# Patient Record
Sex: Male | Born: 1985 | Race: White | Hispanic: No | Marital: Married | Smoking: Current every day smoker
Health system: Southern US, Community
[De-identification: ages and names within clinical notes are randomized; demographics above are authoritative.]

## PROBLEM LIST (undated history)

## (undated) DIAGNOSIS — R748 Abnormal levels of other serum enzymes: Secondary | ICD-10-CM

## (undated) DIAGNOSIS — F419 Anxiety disorder, unspecified: Secondary | ICD-10-CM

## (undated) DIAGNOSIS — E669 Obesity, unspecified: Secondary | ICD-10-CM

## (undated) DIAGNOSIS — F32A Depression, unspecified: Secondary | ICD-10-CM

## (undated) DIAGNOSIS — E785 Hyperlipidemia, unspecified: Secondary | ICD-10-CM

## (undated) DIAGNOSIS — I10 Essential (primary) hypertension: Secondary | ICD-10-CM

## (undated) DIAGNOSIS — Z8719 Personal history of other diseases of the digestive system: Secondary | ICD-10-CM

## (undated) DIAGNOSIS — R7309 Other abnormal glucose: Secondary | ICD-10-CM

## (undated) DIAGNOSIS — F329 Major depressive disorder, single episode, unspecified: Secondary | ICD-10-CM

## (undated) DIAGNOSIS — E119 Type 2 diabetes mellitus without complications: Secondary | ICD-10-CM

## (undated) DIAGNOSIS — J45909 Unspecified asthma, uncomplicated: Secondary | ICD-10-CM

## (undated) DIAGNOSIS — M549 Dorsalgia, unspecified: Secondary | ICD-10-CM

## (undated) DIAGNOSIS — K602 Anal fissure, unspecified: Secondary | ICD-10-CM

## (undated) DIAGNOSIS — Z8619 Personal history of other infectious and parasitic diseases: Secondary | ICD-10-CM

## (undated) DIAGNOSIS — G4730 Sleep apnea, unspecified: Secondary | ICD-10-CM

## (undated) HISTORY — DX: Essential (primary) hypertension: I10

## (undated) HISTORY — DX: Sleep apnea, unspecified: G47.30

## (undated) HISTORY — DX: Obesity, unspecified: E66.9

## (undated) HISTORY — DX: Depression, unspecified: F32.A

## (undated) HISTORY — DX: Anal fissure, unspecified: K60.2

## (undated) HISTORY — DX: Unspecified asthma, uncomplicated: J45.909

## (undated) HISTORY — DX: Type 2 diabetes mellitus without complications: E11.9

## (undated) HISTORY — DX: Personal history of other diseases of the digestive system: Z87.19

## (undated) HISTORY — DX: Personal history of other infectious and parasitic diseases: Z86.19

## (undated) HISTORY — DX: Hyperlipidemia, unspecified: E78.5

## (undated) HISTORY — DX: Abnormal levels of other serum enzymes: R74.8

## (undated) HISTORY — DX: Other abnormal glucose: R73.09

## (undated) HISTORY — DX: Major depressive disorder, single episode, unspecified: F32.9

## (undated) HISTORY — DX: Dorsalgia, unspecified: M54.9

## (undated) HISTORY — DX: Anxiety disorder, unspecified: F41.9

---
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
## 2009-04-09 ENCOUNTER — Ambulatory Visit: Payer: Self-pay | Admitting: Licensed Clinical Social Worker

## 2009-04-14 ENCOUNTER — Ambulatory Visit: Payer: Self-pay | Admitting: Licensed Clinical Social Worker

## 2009-04-29 ENCOUNTER — Ambulatory Visit: Payer: Self-pay | Admitting: Licensed Clinical Social Worker

## 2009-05-26 ENCOUNTER — Ambulatory Visit: Payer: Self-pay | Admitting: Licensed Clinical Social Worker

## 2011-02-16 ENCOUNTER — Emergency Department (HOSPITAL_COMMUNITY)
Admission: EM | Admit: 2011-02-16 | Discharge: 2011-02-16 | Disposition: A | Discharge status: Home / Self Care | Payer: Self-pay | Attending: Emergency Medicine | Admitting: Emergency Medicine

## 2011-02-16 DIAGNOSIS — M545 Low back pain, unspecified: Secondary | ICD-10-CM | POA: Insufficient documentation

## 2011-02-16 DIAGNOSIS — M79609 Pain in unspecified limb: Secondary | ICD-10-CM | POA: Insufficient documentation

## 2011-02-16 DIAGNOSIS — E669 Obesity, unspecified: Secondary | ICD-10-CM | POA: Insufficient documentation

## 2011-02-16 DIAGNOSIS — F411 Generalized anxiety disorder: Secondary | ICD-10-CM | POA: Insufficient documentation

## 2011-02-16 DIAGNOSIS — M5430 Sciatica, unspecified side: Secondary | ICD-10-CM | POA: Insufficient documentation

## 2012-04-23 ENCOUNTER — Ambulatory Visit (INDEPENDENT_AMBULATORY_CARE_PROVIDER_SITE_OTHER): Payer: 59 | Admitting: Internal Medicine

## 2012-04-23 ENCOUNTER — Other Ambulatory Visit (INDEPENDENT_AMBULATORY_CARE_PROVIDER_SITE_OTHER): Payer: 59

## 2012-04-23 ENCOUNTER — Encounter: Payer: Self-pay | Admitting: Internal Medicine

## 2012-04-23 ENCOUNTER — Other Ambulatory Visit: Payer: Self-pay | Admitting: *Deleted

## 2012-04-23 VITALS — BP 118/72 | HR 99 | Temp 97.5°F | Ht 77.0 in | Wt >= 6400 oz

## 2012-04-23 DIAGNOSIS — Z131 Encounter for screening for diabetes mellitus: Secondary | ICD-10-CM

## 2012-04-23 DIAGNOSIS — Z130 Encounter for screening for diseases of the blood and blood-forming organs and certain disorders involving the immune mechanism: Secondary | ICD-10-CM

## 2012-04-23 DIAGNOSIS — Z13220 Encounter for screening for lipoid disorders: Secondary | ICD-10-CM

## 2012-04-23 DIAGNOSIS — Z113 Encounter for screening for infections with a predominantly sexual mode of transmission: Secondary | ICD-10-CM

## 2012-04-23 DIAGNOSIS — Z0000 Encounter for general adult medical examination without abnormal findings: Secondary | ICD-10-CM

## 2012-04-23 LAB — CBC
HCT: 44 % (ref 39.0–52.0)
MCV: 90.5 fl (ref 78.0–100.0)
RBC: 4.87 Mil/uL (ref 4.22–5.81)
RDW: 13.2 % (ref 11.5–14.6)
WBC: 8.4 10*3/uL (ref 4.5–10.5)

## 2012-04-23 LAB — BASIC METABOLIC PANEL
Calcium: 9.6 mg/dL (ref 8.4–10.5)
GFR: 96.47 mL/min (ref >60.00)
Glucose, Bld: 110 mg/dL — ABNORMAL HIGH (ref 70–99)
Potassium: 4.8 mEq/L (ref 3.5–5.1)
Sodium: 138 mEq/L (ref 135–145)

## 2012-04-23 LAB — LIPID PANEL: Total CHOL/HDL Ratio: 7

## 2012-04-23 LAB — RPR

## 2012-04-23 LAB — HEMOGLOBIN A1C: Hgb A1c MFr Bld: 5.8 % (ref 4.6–6.5)

## 2012-04-23 NOTE — Progress Notes (Signed)
HPI  Pt presents to the clinic today to establish care. He has not had insurance in a number of years and would like to get a physical with lab work today. He has no concerns.  Past Medical History  Diagnosis Date  . Asthma   . Depression   . History of chicken pox     Current Outpatient Prescriptions  Medication Sig Dispense Refill  . ibuprofen (ADVIL,MOTRIN) 800 MG tablet Take 800 mg by mouth as needed.         No Known Allergies  Family History  Problem Relation Age of Onset  . Diabetes Maternal Grandmother   . Cancer Maternal Grandfather     Lung Cancer  . Hypertension Maternal Grandfather   . Hypertension Paternal Grandfather   . Heart disease Paternal Grandfather   . Heart attack Paternal Grandfather     History   Social History  . Marital Status: Single    Spouse Name: N/A    Number of Children: N/A  . Years of Education: 16   Occupational History  . [employer]    Social History Main Topics  . Smoking status: Current Every Day Smoker  . Smokeless tobacco: Not on file  . Alcohol Use: Yes  . Drug Use: No  . Sexually Active: Not on file   Other Topics Concern  . Not on file   Social History Narrative   Regular exercise-noCaffeine Use-yes    ROS:  Constitutional: Denies fever, malaise, fatigue, headache or abrupt weight changes.  HEENT: Denies eye pain, eye redness, ear pain, ringing in the ears, wax buildup, runny nose, nasal congestion, bloody nose, or sore throat. Respiratory: Denies difficulty breathing, shortness of breath, cough or sputum production.   Cardiovascular: Denies chest pain, chest tightness, palpitations or swelling in the hands or feet.  Gastrointestinal: Denies abdominal pain, bloating, constipation, diarrhea or blood in the stool.  GU: Denies frequency, urgency, pain with urination, blood in urine, odor or discharge. Musculoskeletal: Denies decrease in range of motion, difficulty with gait, muscle pain or joint pain and swelling.   Skin: Denies redness, rashes, lesions or ulcercations.  Neurological: Denies dizziness, difficulty with memory, difficulty with speech or problems with balance and coordination.   No other specific complaints in a complete review of systems (except as listed in HPI above).  PE:  BP 118/72  Pulse 99  Temp 97.5 F (36.4 C) (Oral)  Ht 6\' 5"  (1.956 m)  Wt 419 lb 8 oz (190.284 kg)  BMI 49.75 kg/m2  SpO2 96% Wt Readings from Last 3 Encounters:  04/23/12 419 lb 8 oz (190.284 kg)    General: Appears his stated age, obese but well developed, well nourished in NAD. HEENT: Head: normal shape and size; Eyes: sclera white, no icterus, conjunctiva pink, PERRLA and EOMs intact; Ears: Tm's gray and intact, normal light reflex; Nose: mucosa pink and moist, septum midline; Throat/Mouth: Teeth present, mucosa pink and moist, no lesions or ulcerations noted.  Neck: Normal range of motion. Neck supple, trachea midline. No massses, lumps or thyromegaly present.  Cardiovascular: Normal rate and rhythm. S1,S2 noted.  No murmur, rubs or gallops noted. No JVD or BLE edema. No carotid bruits noted. Pulmonary/Chest: Normal effort and positive vesicular breath sounds. No respiratory distress. No wheezes, rales or ronchi noted.  Abdomen: Soft and nontender. Normal bowel sounds, no bruits noted. No distention or masses noted. Liver, spleen and kidneys non palpable. Musculoskeletal: Normal range of motion. No signs of joint swelling. No difficulty with  gait.  Neurological: Alert and oriented. Cranial nerves II-XII intact. Coordination normal. +DTRs bilaterally. Psychiatric: Mood and affect normal. Behavior is normal. Judgment and thought content normal.    Assessment and Plan:  Preventative Health Maintenance:  Start a diet and exercise program Will obtain basic screening labs today Flu declined Would like to get tdap at next visit  RTC in 1 year or sooner if needed

## 2012-04-23 NOTE — Patient Instructions (Signed)
Health Maintenance, Males A healthy lifestyle and preventative care can promote health and wellness.  Maintain regular health, dental, and eye exams.  Eat a healthy diet. Foods like vegetables, fruits, whole grains, low-fat dairy products, and lean protein foods contain the nutrients you need without too many calories. Decrease your intake of foods high in solid fats, added sugars, and salt. Get information about a proper diet from your caregiver, if necessary.  Regular physical exercise is one of the most important things you can do for your health. Most adults should get at least 150 minutes of moderate-intensity exercise (any activity that increases your heart rate and causes you to sweat) each week. In addition, most adults need muscle-strengthening exercises on 2 or more days a week.   Maintain a healthy weight. The body mass index (BMI) is a screening tool to identify possible weight problems. It provides an estimate of body fat based on height and weight. Your caregiver can help determine your BMI, and can help you achieve or maintain a healthy weight. For adults 20 years and older:  A BMI below 18.5 is considered underweight.  A BMI of 18.5 to 24.9 is normal.  A BMI of 25 to 29.9 is considered overweight.  A BMI of 30 and above is considered obese.  Maintain normal blood lipids and cholesterol by exercising and minimizing your intake of saturated fat. Eat a balanced diet with plenty of fruits and vegetables. Blood tests for lipids and cholesterol should begin at age 20 and be repeated every 5 years. If your lipid or cholesterol levels are high, you are over 50, or you are a high risk for heart disease, you may need your cholesterol levels checked more frequently.Ongoing high lipid and cholesterol levels should be treated with medicines, if diet and exercise are not effective.  If you smoke, find out from your caregiver how to quit. If you do not use tobacco, do not start.  If you  choose to drink alcohol, do not exceed 2 drinks per day. One drink is considered to be 12 ounces (355 mL) of beer, 5 ounces (148 mL) of wine, or 1.5 ounces (44 mL) of liquor.  Avoid use of street drugs. Do not share needles with anyone. Ask for help if you need support or instructions about stopping the use of drugs.  High blood pressure causes heart disease and increases the risk of stroke. Blood pressure should be checked at least every 1 to 2 years. Ongoing high blood pressure should be treated with medicines if weight loss and exercise are not effective.  If you are 45 to 28 years old, ask your caregiver if you should take aspirin to prevent heart disease.  Diabetes screening involves taking a blood sample to check your fasting blood sugar level. This should be done once every 3 years, after age 45, if you are within normal weight and without risk factors for diabetes. Testing should be considered at a younger age or be carried out more frequently if you are overweight and have at least 1 risk factor for diabetes.  Colorectal cancer can be detected and often prevented. Most routine colorectal cancer screening begins at the age of 50 and continues through age 75. However, your caregiver may recommend screening at an earlier age if you have risk factors for colon cancer. On a yearly basis, your caregiver may provide home test kits to check for hidden blood in the stool. Use of a small camera at the end of a tube,   to directly examine the colon (sigmoidoscopy or colonoscopy), can detect the earliest forms of colorectal cancer. Talk to your caregiver about this at age 50, when routine screening begins. Direct examination of the colon should be repeated every 5 to 10 years through age 75, unless early forms of pre-cancerous polyps or small growths are found.  Hepatitis C blood testing is recommended for all people born from 1945 through 1965 and any individual with known risks for hepatitis C.  Healthy  men should no longer receive prostate-specific antigen (PSA) blood tests as part of routine cancer screening. Consult with your caregiver about prostate cancer screening.  Testicular cancer screening is not recommended for adolescents or adult males who have no symptoms. Screening includes self-exam, caregiver exam, and other screening tests. Consult with your caregiver about any symptoms you have or any concerns you have about testicular cancer.  Practice safe sex. Use condoms and avoid high-risk sexual practices to reduce the spread of sexually transmitted infections (STIs).  Use sunscreen with a sun protection factor (SPF) of 30 or greater. Apply sunscreen liberally and repeatedly throughout the day. You should seek shade when your shadow is shorter than you. Protect yourself by wearing long sleeves, pants, a wide-brimmed hat, and sunglasses year round, whenever you are outdoors.  Notify your caregiver of new moles or changes in moles, especially if there is a change in shape or color. Also notify your caregiver if a mole is larger than the size of a pencil eraser.  A one-time screening for abdominal aortic aneurysm (AAA) and surgical repair of large AAAs by sound wave imaging (ultrasonography) is recommended for ages 65 to 75 years who are current or former smokers.  Stay current with your immunizations. Document Released: 10/14/2007 Document Revised: 07/10/2011 Document Reviewed: 09/12/2010 Exit[employer] Patient Information 2013 [employer]. Self-Exam Of The Testicles Young men ages 15-35 are the ones who most commonly get cancer of the testicles. About 300 young men die of this disease each year. Testicular cancer does occur in middle-aged or older men but to a lesser extent. This cancer can almost always be cured if it is found before it gets bad and if it is treated. WORDS TO KNOW:  Testicles are the 2 egg-shaped glands that make hormones and sperm in men.  The scrotum is the skin around  testicles.  Epididymis is the rope-like part that is behind and above each testis. It collects sperm made by the testis. WHICH MEN ARE AT HIGH RISK FOR CANCER OF THE TESTICLES?  Men between ages 15 to 31.  Men who are Caucasian.  Men who were born with a testicle that had not moved down into the scrotum.  Men whose testicles have gotten smaller because of an infection.  Men whose fathers or brothers have had cancer of the testicles. SYMPTOMS OF TESTICULAR CANCER  A painless swelling in one of your testicles.  A hard lump. Some lumps may be an infection.  A heavy feeling in your testicles.  An ache in your lower belly (abdomen) or groin. HOW OFTEN SHOULD I CHECK MY TESTICLES? You should check your testicles every month. HOW SHOULD I DO THIS CHECK? It is best to check your testicles right after a warm shower or bath.  Look at your testicles for any swelling. You may need to use a mirror.  Use both your hands to roll each testicle between your thumb and fingers. Feel for any lumps or changes in the size of the testicle. Press firmly. You   may find that one testicle is a little bigger than the other. This is normal.  Next, check the epididymis on each testicle. This is the part where most testicular cancers happen. It is normal for the epididymis to feel soft and uneven. When you get used to how your epididymis feels, you will be able to tell if there is any change.  WHAT IF I FIND ANY SWELLINGS OR LUMPS?  Call your doctor. Some lumps may be an infection. If the lump or swelling is a cancer, it can be treated before it gets worse.  Document Released: 07/14/2008 Document Revised: 07/10/2011 Document Reviewed: 07/14/2008 Exit[employer] Patient Information 2013 [employer].  

## 2012-04-24 LAB — HSV(HERPES SIMPLEX VRS) I + II AB-IGG: HSV 1 Glycoprotein G Ab, IgG: 0.22 IV

## 2012-04-25 ENCOUNTER — Encounter: Payer: Self-pay | Admitting: Internal Medicine

## 2012-04-29 ENCOUNTER — Telehealth: Payer: Self-pay | Admitting: *Deleted

## 2012-04-29 NOTE — Telephone Encounter (Signed)
Pt informed of results via VM and to callback office with any questions/concerns.      Maurice George,  Can you please send this and call Maurice George to let him know that his cholesterol is elevated. I suggest 3 months of lifestyle changes including avoiding fried foods and fast food in combination with at least 30 minutes of exercise 4 days out of the week. You should return for an OV in 3 months to reevaluate this. All of his other labs were normal. Thx!  Rene Kocher

## 2012-06-09 ENCOUNTER — Encounter (HOSPITAL_COMMUNITY): Payer: Self-pay

## 2012-06-09 ENCOUNTER — Emergency Department (HOSPITAL_COMMUNITY)
Admission: EM | Admit: 2012-06-09 | Discharge: 2012-06-09 | Disposition: A | Discharge status: Home / Self Care | Payer: 59 | Attending: Emergency Medicine | Admitting: Emergency Medicine

## 2012-06-09 DIAGNOSIS — K645 Perianal venous thrombosis: Secondary | ICD-10-CM

## 2012-06-09 DIAGNOSIS — F17200 Nicotine dependence, unspecified, uncomplicated: Secondary | ICD-10-CM | POA: Insufficient documentation

## 2012-06-09 DIAGNOSIS — Z79899 Other long term (current) drug therapy: Secondary | ICD-10-CM | POA: Insufficient documentation

## 2012-06-09 DIAGNOSIS — J45909 Unspecified asthma, uncomplicated: Secondary | ICD-10-CM | POA: Insufficient documentation

## 2012-06-09 DIAGNOSIS — Z8619 Personal history of other infectious and parasitic diseases: Secondary | ICD-10-CM | POA: Insufficient documentation

## 2012-06-09 DIAGNOSIS — Z8659 Personal history of other mental and behavioral disorders: Secondary | ICD-10-CM | POA: Insufficient documentation

## 2012-06-09 MED ORDER — LIDOCAINE 5 % EX OINT
TOPICAL_OINTMENT | Freq: Three times a day (TID) | CUTANEOUS | Status: DC | PRN
  Administered 2012-06-09: 09:00:00 via TOPICAL
  Filled 2012-06-09: qty 35.44

## 2012-06-09 MED ORDER — OXYCODONE-ACETAMINOPHEN 5-325 MG PO TABS: ORAL_TABLET | ORAL | Status: DC

## 2012-06-09 MED ORDER — HYDROCORTISONE 2.5 % RE CREA: TOPICAL_CREAM | RECTAL | Status: DC

## 2012-06-09 NOTE — ED Notes (Signed)
MD at bedside. 

## 2012-06-09 NOTE — ED Notes (Signed)
Rectal pain x4 days

## 2012-06-09 NOTE — ED Notes (Addendum)
Patient states that he has been having burning, sharp, throbbing pain in and around his rectum for 4 days.  States he does have a history of external hemorrhoids.  The patient states that he has had this type of pain before but has never been seen by a physician for it.  States he has had blood in his stool and on toilet paper after wiping the area.

## 2012-06-09 NOTE — ED Provider Notes (Signed)
History     CSN: [id number]  Arrival date & time 06/09/12  0715   First MD Initiated Contact with Patient 06/09/12 [phone]      Chief Complaint  Patient presents with  . Rectal Pain      HPI Pt was seen at 0745.   Per pt, c/o gradual onset and persistence of constant rectal "pain" for the past 4 days.  Pt states he has had intermittent rectal pain from external hemorrhoids "for years" but that it usually goes away with OTC products.  Pt states he has been using OTC hemorrhoidal products for the past 4 days without relief.  Denies constipation/diarrhea, no rectal bleeding or discharge, no fevers, no abd pain, no back pain, no perineal pain.    Past Medical History  Diagnosis Date  . Asthma   . Depression   . History of chicken pox     Past Surgical History  Procedure Laterality Date  . Tonsillectomy  1999    Family History  Problem Relation Age of Onset  . Diabetes Maternal Grandmother   . Cancer Maternal Grandfather     Lung Cancer  . Hypertension Maternal Grandfather   . Hypertension Paternal Grandfather   . Heart disease Paternal Grandfather   . Heart attack Paternal Grandfather     History  Substance Use Topics  . Smoking status: Current Every Day Smoker -- 0.50 packs/day    Types: Cigarettes  . Smokeless tobacco: Never Used  . Alcohol Use: Yes      Review of Systems ROS: Statement: All systems negative except as marked or noted in the HPI; Constitutional: Negative for fever and chills. ; ; Eyes: Negative for eye pain, redness and discharge. ; ; ENMT: Negative for ear pain, hoarseness, nasal congestion, sinus pressure and sore throat. ; ; Cardiovascular: Negative for chest pain, palpitations, diaphoresis, dyspnea and peripheral edema. ; ; Respiratory: Negative for cough, wheezing and stridor. ; ; Gastrointestinal: +rectal pain. Negative for nausea, vomiting, diarrhea, abdominal pain, blood in stool, hematemesis, jaundice and rectal bleeding. . ; ; Genitourinary:  Negative for dysuria, flank pain and hematuria. ; ; Musculoskeletal: Negative for back pain and neck pain. Negative for swelling and trauma.; ; Skin: Negative for pruritus, rash, abrasions, blisters, bruising and skin lesion.; ; Neuro: Negative for headache, lightheadedness and neck stiffness. Negative for weakness, altered level of consciousness , altered mental status, extremity weakness, paresthesias, involuntary movement, seizure and syncope.       Allergies  Review of patient's allergies indicates no known allergies.  Home Medications   Current Outpatient Rx  Name  Route  Sig  Dispense  Refill  . ibuprofen (ADVIL,MOTRIN) 800 MG tablet   Oral   Take 800 mg by mouth as needed.            BP 158/81  Pulse 80  Temp(Src) 97.3 F (36.3 C) (Oral)  Resp 18  Ht 6\' 4"  (1.93 m)  Wt 410 lb (185.975 kg)  BMI 49.93 kg/m2  SpO2 97%  Physical Exam 0750: Physical examination:  Nursing notes reviewed; Vital signs and O2 SAT reviewed;  Constitutional: Well developed, Well nourished, Well hydrated, In no acute distress; Head:  Normocephalic, atraumatic; Eyes: EOMI, PERRL, No scleral icterus; ENMT: Mouth and pharynx normal, Mucous membranes moist; Neck: Supple, Full range of motion, No lymphadenopathy; Cardiovascular: Regular rate and rhythm, No murmur, rub, or gallop; Respiratory: Breath sounds clear & equal bilaterally, No rales, rhonchi, wheezes.  Speaking full sentences with ease, Normal respiratory effort/excursion; Chest:  Nontender, Movement normal; Abdomen: Soft, Nontender, Nondistended, Normal bowel sounds. Rectal exam performed w/permission of pt and ED RN chaparone present.  Anal tone normal.  No fissures, +tender right thrombosed external hemorrhoid. No bleeding.;; Extremities: Pulses normal, No tenderness, No edema, No calf edema or asymmetry.; Neuro: AA&Ox3, Major CN grossly intact.  Speech clear. No gross focal motor or sensory deficits in extremities.; Skin: Color normal, Warm,  Dry.   ED Course  Procedures    MDM  MDM Reviewed: nursing note and vitals     0800:  T/C to General Surgeon Dr. Leticia Penna, case discussed, including:  HPI, pertinent PM/SHx, VS/PE, dx testing, ED course and treatment:  will eval in ofc tomorrow.  Dx d/w pt.  Questions answered.  Verb understanding, agreeable to d/c home with outpt f/u with Surgeon tomorrow.         Laray Anger, DO 06/11/12 1744

## 2013-05-25 ENCOUNTER — Emergency Department: Payer: Self-pay | Admitting: Emergency Medicine

## 2013-05-25 LAB — URINALYSIS, COMPLETE
BLOOD: NEGATIVE
Bilirubin,UR: NEGATIVE
GLUCOSE, UR: NEGATIVE mg/dL (ref 0–75)
Ketone: NEGATIVE
LEUKOCYTE ESTERASE: NEGATIVE
NITRITE: NEGATIVE
PH: 5 (ref 4.5–8.0)
SPECIFIC GRAVITY: 1.032 (ref 1.003–1.030)
WBC UR: 2 /HPF (ref 0–5)

## 2013-05-25 LAB — CBC
HCT: 45.8 % (ref 40.0–52.0)
HGB: 15.2 g/dL (ref 13.0–18.0)
MCH: 30.4 pg (ref 26.0–34.0)
MCHC: 33.1 g/dL (ref 32.0–36.0)
MCV: 92 fL (ref 80–100)
Platelet: 248 10*3/uL (ref 150–440)
RBC: 4.99 10*6/uL (ref 4.40–5.90)
RDW: 12.9 % (ref 11.5–14.5)
WBC: 14.1 10*3/uL — ABNORMAL HIGH (ref 3.8–10.6)

## 2013-05-25 LAB — COMPREHENSIVE METABOLIC PANEL
ALK PHOS: 73 U/L
ALT: 93 U/L — AB (ref 12–78)
ANION GAP: 7 (ref 7–16)
Albumin: 4.1 g/dL (ref 3.4–5.0)
BILIRUBIN TOTAL: 0.5 mg/dL (ref 0.2–1.0)
BUN: 15 mg/dL (ref 7–18)
CREATININE: 1.36 mg/dL — AB (ref 0.60–1.30)
Calcium, Total: 9 mg/dL (ref 8.5–10.1)
Chloride: 107 mmol/L (ref 98–107)
Co2: 24 mmol/L (ref 21–32)
GLUCOSE: 133 mg/dL — AB (ref 65–99)
OSMOLALITY: 278 (ref 275–301)
POTASSIUM: 3.2 mmol/L — AB (ref 3.5–5.1)
SGOT(AST): 39 U/L — ABNORMAL HIGH (ref 15–37)
Sodium: 138 mmol/L (ref 136–145)
TOTAL PROTEIN: 7.2 g/dL (ref 6.4–8.2)

## 2013-05-25 LAB — CK TOTAL AND CKMB (NOT AT ARMC)
CK, Total: 163 U/L (ref 35–232)
CK-MB: 1.2 ng/mL (ref 0.5–3.6)

## 2013-05-25 LAB — TROPONIN I
Troponin-I: <0.02 ng/mL
Troponin-I: <0.02 ng/mL

## 2013-05-29 ENCOUNTER — Ambulatory Visit (INDEPENDENT_AMBULATORY_CARE_PROVIDER_SITE_OTHER): Payer: 59 | Admitting: Cardiovascular Disease

## 2013-05-29 ENCOUNTER — Encounter (INDEPENDENT_AMBULATORY_CARE_PROVIDER_SITE_OTHER): Payer: Self-pay

## 2013-05-29 ENCOUNTER — Encounter: Payer: Self-pay | Admitting: Cardiovascular Disease

## 2013-05-29 VITALS — BP 120/82 | HR 87 | Ht 76.0 in | Wt >= 6400 oz

## 2013-05-29 DIAGNOSIS — E785 Hyperlipidemia, unspecified: Secondary | ICD-10-CM | POA: Insufficient documentation

## 2013-05-29 DIAGNOSIS — R079 Chest pain, unspecified: Secondary | ICD-10-CM | POA: Insufficient documentation

## 2013-05-29 DIAGNOSIS — R9431 Abnormal electrocardiogram [ECG] [EKG]: Secondary | ICD-10-CM

## 2013-05-29 DIAGNOSIS — I498 Other specified cardiac arrhythmias: Secondary | ICD-10-CM

## 2013-05-29 DIAGNOSIS — G4733 Obstructive sleep apnea (adult) (pediatric): Secondary | ICD-10-CM | POA: Insufficient documentation

## 2013-05-29 DIAGNOSIS — I471 Supraventricular tachycardia: Secondary | ICD-10-CM

## 2013-05-29 DIAGNOSIS — R000 Tachycardia, unspecified: Secondary | ICD-10-CM

## 2013-05-29 MED ORDER — PROPRANOLOL HCL 20 MG PO TABS: 20.0000 mg | ORAL_TABLET | Freq: Three times a day (TID) | ORAL | Status: DC | PRN

## 2013-05-29 NOTE — Assessment & Plan Note (Signed)
Mild chest tightness in the setting of tachycardia. Likely from underlying rate of 150 beats per minute. Suggested he contact us for any further symptoms

## 2013-05-29 NOTE — Patient Instructions (Signed)
You are doing well.  Please take propranolol as needed for tachycardia Ok to repeat 30 minutes later if needed  Please call us if you have new issues that need to be addressed before your next appt.

## 2013-05-29 NOTE — Progress Notes (Signed)
Patient ID: Maurice George, male    DOB: 12/30/1985, 28 y.o.   MRN: [id number]  HPI Comments: 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  Reports that he was sitting with his fiance watching a movie when he had acute onset of tachycardia. He had some associated shortness of breath. Symptoms did not improve after walking around the house, putting cold towels on his face, deep breathing. He went to the emergency room  He reports heart rate in the emergency room was 150 beats per minute. He was over one hour before heart rate returned to normal. He reports that he had significant blood work, EKG, was given a nitroglycerin and aspirin. D-dimer was normal, potassium was 3.2, cardiac enzymes normal, UA negative, creatinine 1.36, AST 39, ALT 93, normal CBC  He reports having previous episodes of panic attacks, none recently. He does have significant stressors at home with his job and finances. Reports having prior episodes of tachycardia associated with panic attacks, not this severe as last week  Previous lab work December 2014 showing LDL 185, HDL 38, total cholesterol 409253, AST 29, ALT 66  EKG shows normal sinus rhythm with rate 87 beats per minute, no significant ST or T wave changes    Outpatient Encounter Prescriptions as of 05/29/2013  Medication Sig  . colesevelam (WELCHOL) 625 MG tablet Take 625 mg (3 tablets) at breakfast & 625 mg (3 tablets) at dinner.  . cyclobenzaprine (FLEXERIL) 10 MG tablet Take 10 mg by mouth as needed.   Tery Sanfilippo. Docusate Calcium (STOOL SOFTENER PO) Take by mouth as needed.  . gabapentin (NEURONTIN) 300 MG capsule Take 600 mg by mouth at bedtime.   Marland Kitchen. ibuprofen (ADVIL,MOTRIN) 800 MG tablet Take 600 mg by mouth 2 (two) times daily.   Marland Kitchen. lidocaine (XYLOCAINE) 2 % jelly Apply 1  application topically as needed.   . traMADol (ULTRAM) 50 MG tablet Take 50 mg by mouth 2 (two) times daily.   . [DISCONTINUED] hydrocortisone (ANUSOL-HC) 2.5 % rectal cream Apply rectally 2 times daily     Review of Systems  Constitutional: Negative.   HENT: Negative.   Eyes: Negative.   Respiratory: Negative.   Cardiovascular: Positive for palpitations.       Tachycardia  Gastrointestinal: Negative.   Endocrine: Negative.   Musculoskeletal: Negative.   Skin: Negative.   Allergic/Immunologic: Negative.   Neurological: Negative.   Hematological: Negative.   Psychiatric/Behavioral: Negative.   All other systems reviewed and are negative.    BP 120/82  Pulse 87  Ht 6\' 4"  (1.93 m)  Wt 429 lb (194.593 kg)  BMI 52.24 kg/m2  Physical Exam  Nursing note and vitals reviewed. Constitutional: He is oriented to person, place, and time. He appears well-developed and well-nourished.  HENT:  Head: Normocephalic.  Nose: Nose normal.  Mouth/Throat: Oropharynx is clear and moist.  Eyes: Conjunctivae are normal. Pupils are equal, round, and reactive to light.  Neck: Normal range of motion. Neck supple. No JVD present.  Cardiovascular: Normal rate, regular rhythm, S1 normal, S2 normal, normal heart sounds and intact distal pulses.  Exam reveals no gallop and no friction rub.   No murmur heard. Pulmonary/Chest: Effort normal and breath sounds normal. No respiratory distress. He has no wheezes. He has no rales. He exhibits no tenderness.  Abdominal: Soft. Bowel sounds  are normal. He exhibits no distension. There is no tenderness.  Musculoskeletal: Normal range of motion. He exhibits no edema and no tenderness.  Lymphadenopathy:    He has no cervical adenopathy.  Neurological: He is alert and oriented to person, place, and time. Coordination normal.  Skin: Skin is warm and dry. No rash noted. No erythema.  Psychiatric: He has a normal mood and affect. His behavior is normal. Judgment and  thought content normal.      Assessment and Plan

## 2013-05-29 NOTE — Assessment & Plan Note (Signed)
We have encouraged continued exercise, careful diet management in an effort to lose weight. This will help with his LFTs, lipids

## 2013-05-29 NOTE — Assessment & Plan Note (Signed)
Recent episode of atrial tachycardia with rates up to 150s. Resolved without intervention in the emergency room. We will give him propranolol to take as needed for any recurrent symptoms. Normal EKG and clinical exam. No further workup at this time

## 2013-05-29 NOTE — Assessment & Plan Note (Signed)
We discussed various options for his hyperlipidemia. We have asked him to check the Price of the WelChol as he does report having some financial issues. Suggested he have lipid panel after being on WelChol for 2 months. If no dramatic improvement, would suggest changing to a statin. Neysa BonitoFianc is concerned about statins in the setting of NASH. Could start on low-dose with periodic evaluation of his LFTs.

## 2013-05-29 NOTE — Assessment & Plan Note (Signed)
He believes he has symptoms of obstructive sleep apnea. Recommended continued weight loss, daily exercise, aggressive diet modification

## 2013-10-06 ENCOUNTER — Emergency Department: Payer: Self-pay | Admitting: Emergency Medicine

## 2013-10-06 LAB — BASIC METABOLIC PANEL
Anion Gap: 7 (ref 7–16)
BUN: 18 mg/dL (ref 7–18)
CHLORIDE: 109 mmol/L — AB (ref 98–107)
CO2: 22 mmol/L (ref 21–32)
Calcium, Total: 8.9 mg/dL (ref 8.5–10.1)
Creatinine: 1 mg/dL (ref 0.60–1.30)
EGFR (African American): >60
Glucose: 138 mg/dL — ABNORMAL HIGH (ref 65–99)
Osmolality: 280 (ref 275–301)
Potassium: 3.9 mmol/L (ref 3.5–5.1)
SODIUM: 138 mmol/L (ref 136–145)

## 2013-10-06 LAB — CBC
HCT: 47.4 % (ref 40.0–52.0)
HGB: 15.4 g/dL (ref 13.0–18.0)
MCH: 29.9 pg (ref 26.0–34.0)
MCHC: 32.5 g/dL (ref 32.0–36.0)
MCV: 92 fL (ref 80–100)
PLATELETS: 263 10*3/uL (ref 150–440)
RBC: 5.14 10*6/uL (ref 4.40–5.90)
RDW: 12.9 % (ref 11.5–14.5)
WBC: 12.9 10*3/uL — AB (ref 3.8–10.6)

## 2013-10-06 LAB — TROPONIN I

## 2013-10-13 ENCOUNTER — Emergency Department: Payer: Self-pay | Admitting: Emergency Medicine

## 2013-10-13 LAB — BASIC METABOLIC PANEL
Anion Gap: 6 — ABNORMAL LOW (ref 7–16)
BUN: 11 mg/dL (ref 7–18)
CHLORIDE: 108 mmol/L — AB (ref 98–107)
CO2: 26 mmol/L (ref 21–32)
Calcium, Total: 9.1 mg/dL (ref 8.5–10.1)
Creatinine: 1.09 mg/dL (ref 0.60–1.30)
EGFR (Non-African Amer.): >60
GLUCOSE: 111 mg/dL — AB (ref 65–99)
OSMOLALITY: 279 (ref 275–301)
Potassium: 3.7 mmol/L (ref 3.5–5.1)
SODIUM: 140 mmol/L (ref 136–145)

## 2013-10-13 LAB — CBC
HCT: 46.4 % (ref 40.0–52.0)
HGB: 15.3 g/dL (ref 13.0–18.0)
MCH: 30.6 pg (ref 26.0–34.0)
MCHC: 33 g/dL (ref 32.0–36.0)
MCV: 93 fL (ref 80–100)
Platelet: 215 10*3/uL (ref 150–440)
RBC: 5.01 10*6/uL (ref 4.40–5.90)
RDW: 13.1 % (ref 11.5–14.5)
WBC: 9.5 10*3/uL (ref 3.8–10.6)

## 2013-10-13 LAB — MAGNESIUM: Magnesium: 1.6 mg/dL — ABNORMAL LOW

## 2013-10-13 LAB — TROPONIN I: Troponin-I: <0.02 ng/mL

## 2013-10-16 DIAGNOSIS — F1220 Cannabis dependence, uncomplicated: Secondary | ICD-10-CM | POA: Insufficient documentation

## 2013-11-06 ENCOUNTER — Ambulatory Visit: Payer: Self-pay | Admitting: Otolaryngology

## 2014-02-03 IMAGING — CR DG CHEST 1V PORT
1 series · 1 of 1 positions shown · non-contrast
Comparison: None.

CLINICAL DATA: Chest pain.

EXAM:
PORTABLE CHEST - 1 VIEW

[ap]
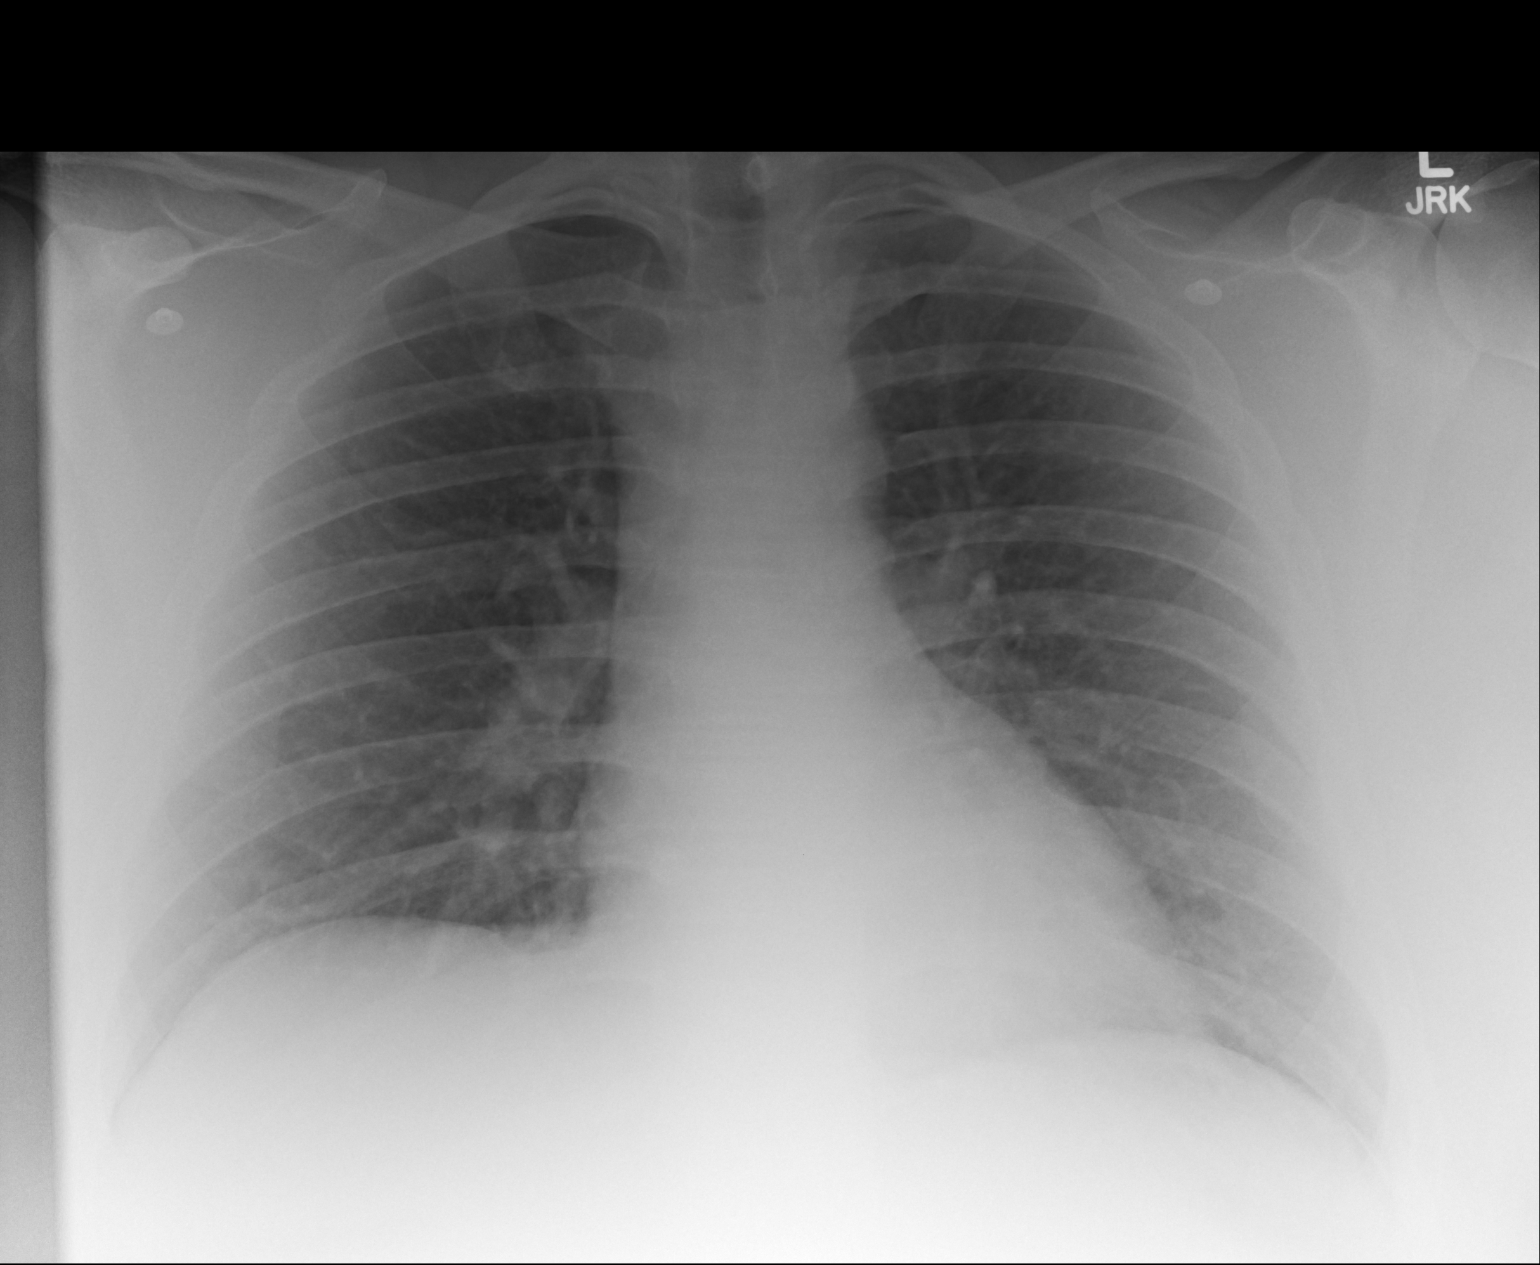

[1 of 1 positions shown; findings below may reference images not displayed]

FINDINGS: The heart size and mediastinal contours are within normal limits.
Both lungs are clear. The visualized skeletal structures are
unremarkable.
IMPRESSION: Normal chest.

## 2014-02-04 DIAGNOSIS — F54 Psychological and behavioral factors associated with disorders or diseases classified elsewhere: Secondary | ICD-10-CM | POA: Insufficient documentation

## 2014-02-10 DIAGNOSIS — R7303 Prediabetes: Secondary | ICD-10-CM | POA: Insufficient documentation

## 2014-07-03 DIAGNOSIS — F32A Depression, unspecified: Secondary | ICD-10-CM | POA: Insufficient documentation

## 2016-04-14 DIAGNOSIS — F17200 Nicotine dependence, unspecified, uncomplicated: Secondary | ICD-10-CM | POA: Insufficient documentation

## 2016-04-14 DIAGNOSIS — Z6841 Body Mass Index (BMI) 40.0 and over, adult: Secondary | ICD-10-CM | POA: Insufficient documentation

## 2016-05-15 ENCOUNTER — Encounter (HOSPITAL_COMMUNITY): Payer: Self-pay | Admitting: Emergency Medicine

## 2016-05-15 ENCOUNTER — Emergency Department (HOSPITAL_COMMUNITY)
Admission: EM | Admit: 2016-05-15 | Discharge: 2016-05-15 | Disposition: A | Discharge status: Home / Self Care | Payer: 59 | Attending: Dermatology | Admitting: Dermatology

## 2016-05-15 DIAGNOSIS — M25561 Pain in right knee: Secondary | ICD-10-CM | POA: Diagnosis not present

## 2016-05-15 DIAGNOSIS — Z5321 Procedure and treatment not carried out due to patient leaving prior to being seen by health care provider: Secondary | ICD-10-CM | POA: Insufficient documentation

## 2016-05-15 DIAGNOSIS — M25551 Pain in right hip: Secondary | ICD-10-CM | POA: Diagnosis not present

## 2016-05-15 DIAGNOSIS — M545 Low back pain: Secondary | ICD-10-CM | POA: Insufficient documentation

## 2016-05-15 DIAGNOSIS — F17210 Nicotine dependence, cigarettes, uncomplicated: Secondary | ICD-10-CM | POA: Insufficient documentation

## 2016-05-15 DIAGNOSIS — J45909 Unspecified asthma, uncomplicated: Secondary | ICD-10-CM | POA: Diagnosis not present

## 2016-05-15 NOTE — ED Notes (Signed)
Pt did not respond when called for a room.

## 2016-05-15 NOTE — ED Notes (Signed)
Called pts name to obtain and update vitals, x3 no answer.

## 2016-05-15 NOTE — ED Notes (Signed)
Pt called for room with no response.  °

## 2016-05-15 NOTE — ED Triage Notes (Signed)
Pt c/o right low back pain that radiates into right hip down to right knee x couple days. Pt reports history of hip deformity.

## 2016-06-19 ENCOUNTER — Ambulatory Visit (INDEPENDENT_AMBULATORY_CARE_PROVIDER_SITE_OTHER): Payer: 59 | Admitting: Psychology

## 2016-06-19 DIAGNOSIS — F3131 Bipolar disorder, current episode depressed, mild: Secondary | ICD-10-CM | POA: Diagnosis not present

## 2016-07-04 ENCOUNTER — Ambulatory Visit: Payer: 59 | Admitting: Psychology

## 2016-07-18 ENCOUNTER — Ambulatory Visit (INDEPENDENT_AMBULATORY_CARE_PROVIDER_SITE_OTHER): Payer: 59 | Admitting: Psychology

## 2016-07-18 DIAGNOSIS — F3132 Bipolar disorder, current episode depressed, moderate: Secondary | ICD-10-CM | POA: Diagnosis not present

## 2016-08-01 ENCOUNTER — Ambulatory Visit (INDEPENDENT_AMBULATORY_CARE_PROVIDER_SITE_OTHER): Payer: 59 | Admitting: Psychology

## 2016-08-01 DIAGNOSIS — F3132 Bipolar disorder, current episode depressed, moderate: Secondary | ICD-10-CM | POA: Diagnosis not present

## 2016-08-15 ENCOUNTER — Ambulatory Visit: Payer: 59 | Admitting: Psychology

## 2016-08-29 ENCOUNTER — Ambulatory Visit: Payer: 59 | Admitting: Psychology

## 2016-09-12 ENCOUNTER — Ambulatory Visit (INDEPENDENT_AMBULATORY_CARE_PROVIDER_SITE_OTHER): Payer: 59 | Admitting: Psychology

## 2016-09-12 DIAGNOSIS — F3132 Bipolar disorder, current episode depressed, moderate: Secondary | ICD-10-CM

## 2016-09-26 ENCOUNTER — Ambulatory Visit: Payer: 59 | Admitting: Psychology

## 2016-10-10 ENCOUNTER — Ambulatory Visit: Payer: 59 | Admitting: Psychology

## 2017-02-27 DIAGNOSIS — R002 Palpitations: Secondary | ICD-10-CM | POA: Insufficient documentation

## 2018-03-21 DIAGNOSIS — F411 Generalized anxiety disorder: Secondary | ICD-10-CM | POA: Insufficient documentation

## 2018-07-05 DIAGNOSIS — I10 Essential (primary) hypertension: Secondary | ICD-10-CM | POA: Insufficient documentation

## 2019-01-01 DIAGNOSIS — E1165 Type 2 diabetes mellitus with hyperglycemia: Secondary | ICD-10-CM | POA: Insufficient documentation

## 2019-08-29 ENCOUNTER — Emergency Department: Payer: 59

## 2019-08-29 ENCOUNTER — Encounter: Payer: Self-pay | Admitting: Emergency Medicine

## 2019-08-29 ENCOUNTER — Emergency Department
Admission: EM | Admit: 2019-08-29 | Discharge: 2019-08-29 | Disposition: A | Discharge status: Home / Self Care | Payer: 59 | Attending: Emergency Medicine | Admitting: Emergency Medicine

## 2019-08-29 ENCOUNTER — Other Ambulatory Visit: Payer: Self-pay

## 2019-08-29 DIAGNOSIS — Y999 Unspecified external cause status: Secondary | ICD-10-CM | POA: Insufficient documentation

## 2019-08-29 DIAGNOSIS — Y929 Unspecified place or not applicable: Secondary | ICD-10-CM | POA: Diagnosis not present

## 2019-08-29 DIAGNOSIS — Z79899 Other long term (current) drug therapy: Secondary | ICD-10-CM | POA: Insufficient documentation

## 2019-08-29 DIAGNOSIS — Y939 Activity, unspecified: Secondary | ICD-10-CM | POA: Insufficient documentation

## 2019-08-29 DIAGNOSIS — X500XXA Overexertion from strenuous movement or load, initial encounter: Secondary | ICD-10-CM | POA: Diagnosis not present

## 2019-08-29 DIAGNOSIS — F17210 Nicotine dependence, cigarettes, uncomplicated: Secondary | ICD-10-CM | POA: Insufficient documentation

## 2019-08-29 DIAGNOSIS — S93402A Sprain of unspecified ligament of left ankle, initial encounter: Secondary | ICD-10-CM | POA: Diagnosis not present

## 2019-08-29 DIAGNOSIS — S99912A Unspecified injury of left ankle, initial encounter: Secondary | ICD-10-CM | POA: Diagnosis present

## 2019-08-29 DIAGNOSIS — J45909 Unspecified asthma, uncomplicated: Secondary | ICD-10-CM | POA: Diagnosis not present

## 2019-08-29 MED ORDER — HYDROCODONE-ACETAMINOPHEN 5-325 MG PO TABS: 1.0000 | ORAL_TABLET | Freq: Three times a day (TID) | ORAL | 0 refills | Status: AC | PRN

## 2019-08-29 MED ORDER — DICLOFENAC SODIUM 50 MG PO TBEC: 50.0000 mg | DELAYED_RELEASE_TABLET | Freq: Two times a day (BID) | ORAL | 0 refills | Status: DC

## 2019-08-29 NOTE — ED Provider Notes (Signed)
[facility] Emergency Department Provider Note ____________________________________________  Time seen: 1711  I have reviewed the triage vital signs and the nursing notes.  HISTORY  Chief Complaint  Ankle Pain  HPI Maurice George is a 34 y.o. male presents to the ED for evaluation of acute left ankle pain.  Patient describes he rolled his ankle after he accidentally stepped on a dog toy   Past Medical History:  Diagnosis Date  . Abnormal liver enzymes   . Anxiety   . Asthma   . Backache, unspecified   . Depression   . H/O hemorrhoids   . History of chicken pox   . Hyperlipidemia   . Obesity   . Other abnormal glucose     Patient Active Problem List   Diagnosis Date Noted  . Atrial tachycardia (HCC) 05/29/2013  . Chest pain 05/29/2013  . Morbid obesity (HCC) 05/29/2013  . Hyperlipidemia 05/29/2013  . OSA (obstructive sleep apnea) 05/29/2013    Past Surgical History:  Procedure Laterality Date  . TONSILLECTOMY  1999    Prior to Admission medications   Medication Sig Start Date End Date Taking? Authorizing Provider  colesevelam (WELCHOL) 625 MG tablet Take 625 mg (3 tablets) at breakfast & 625 mg (3 tablets) at dinner.    [provider]  cyclobenzaprine (FLEXERIL) 10 MG tablet Take 10 mg by mouth as needed.  05/05/13   [provider]  Docusate Calcium (STOOL SOFTENER PO) Take by mouth as needed.    [provider]  gabapentin (NEURONTIN) 300 MG capsule Take 600 mg by mouth at bedtime.  05/05/13   [provider]  ibuprofen (ADVIL,MOTRIN) 800 MG tablet Take 600 mg by mouth 2 (two) times daily.  03/13/12   [provider]  lidocaine (XYLOCAINE) 2 % jelly Apply 1 application topically as needed.  04/29/13   [provider]  propranolol (INDERAL) 20 MG tablet Take 1 tablet (20 mg total) by mouth 3 (three) times daily as needed. 05/29/13   Antonieta Iba, MD  traMADol (ULTRAM) 50 MG tablet Take 50  mg by mouth 2 (two) times daily.  05/05/13   [provider]    Allergies Tamiflu [oseltamivir]  Family History  Problem Relation Age of Onset  . Hypertension Paternal Grandfather   . Heart disease Paternal Grandfather   . Heart attack Paternal Grandfather   . Diabetes Maternal Grandmother   . Cancer Maternal Grandfather        Lung Cancer  . Hypertension Maternal Grandfather     Social History Social History   Tobacco Use  . Smoking status: Current Every Day Smoker    Packs/day: 0.50    Types: Cigarettes  . Smokeless tobacco: Never Used  Substance Use Topics  . Alcohol use: Yes  . Drug use: Yes    Types: Marijuana    Review of Systems  Constitutional: Negative for fever. Cardiovascular: Negative for chest pain. Respiratory: Negative for shortness of breath. Musculoskeletal: Negative for back pain. Left ankle pain  Skin: Negative for rash. Neurological: Negative for headaches, focal weakness or numbness. ____________________________________________  PHYSICAL EXAM:  VITAL SIGNS: ED Triage Vitals  Enc Vitals Group     BP 08/29/19 1702 (!) 170/95     Pulse Rate 08/29/19 1702 91     Resp 08/29/19 1702 18     Temp 08/29/19 1702 98.7 F (37.1 C)     Temp Source 08/29/19 1702 Oral     SpO2 08/29/19 1702 97 %  Weight 08/29/19 1701 (!) 496 lb (225 kg)     Height 08/29/19 1701 6\' 4"  (1.93 m)     Head Circumference --      Peak Flow --      Pain Score 08/29/19 1705 7     Pain Loc --      Pain Edu? --      Excl. in [place]? --     Constitutional: Alert and oriented. Well appearing and in no distress. Head: Normocephalic and atraumatic. Eyes: Conjunctivae are normal. Normal extraocular movements Cardiovascular: Normal rate, regular rhythm. Normal distal pulses. Respiratory: Normal respiratory effort.  Musculoskeletal: left ankle obvious deformity or dislocation.  Patient with mild soft tissue swelling to the lateral aspect of the ankle.  Patient with normal  ankle range of motion on exam.  Negative drawer sign.  Mild tender to palpation of the Achilles tendon.  Negative Thompson test.  Nontender with normal range of motion in all extremities.  Neurologic: Antalgic gait without ataxia. Normal speech and language. No gross focal neurologic deficits are appreciated. Skin:  Skin is warm, dry and intact. No rash noted. ____________________________________________   RADIOLOGY  DG Left Ankle  Negative  I, Mina Babula V Bacon-Atharv Barriere, personally viewed and evaluated these images (plain radiographs) as part of my medical decision making, as well as reviewing the written report by the radiologist. ____________________________________________  PROCEDURES  CAM walker Ace bandage  Procedures ____________________________________________  INITIAL IMPRESSION / ASSESSMENT AND PLAN / ED COURSE  Presents to the ED for evaluation of acute left ankle pain.  Patient's exam is consistent with a grade 1-2 left ankle sprain.  No radiologic evidence of fracture or dislocation.  Patient will be placed in a cam walker for comfort and support but he is referred to podiatry for ongoing symptom management.  Prescription for hydrocodone (No. 9) and diclofenac as provided.   Maurice George was evaluated in Emergency Department on 08/29/2019 for the symptoms described in the history of present illness. He was evaluated in the context of the global COVID-19 pandemic, which necessitated consideration that the patient might be at risk for infection with the SARS-CoV-2 virus that causes COVID-19. Institutional protocols and algorithms that pertain to the evaluation of patients at risk for COVID-19 are in a state of rapid change based on information released by regulatory bodies including the CDC and federal and state organizations. These policies and algorithms were followed during the patient's care in the ED.  I reviewed the patient's prescription history over the last 12 months in  the multi-state controlled substances database(s) that includes [place], [place], [place], [place], [place], [place], [place], [place], [place], [place], [place], [place], [place], and [place].  Results were notable for monthly BZD Rx.  ____________________________________________  FINAL CLINICAL IMPRESSION(S) / ED DIAGNOSES  Final diagnoses:  None      Carmie End, Dannielle Karvonen, PA-C 08/29/19 1828    Harvest Dark, MD 08/29/19 2313

## 2019-08-29 NOTE — ED Notes (Signed)
See triage note  States he stepped on a dog rope   Twisted ankle  Larey Seat down couple of steps  Unable to bear wt

## 2019-08-29 NOTE — ED Triage Notes (Signed)
Pt presents to ED c/o L ankle pain after slipping on dog toy and rolling L ankle. Has been using ice and ibuprofen at home (last taken 1130).

## 2019-08-29 NOTE — Discharge Instructions (Addendum)
Your exam and XR are consistent with a Grade II ankle sprain. Wear the boot for support. Rest with the foot elevated and apply ice to reduce symptoms. Take the daily Diclofenac as directed and the pain medicine as needed. Take your previously prescribed cyclobenzaprine as well. Follow-up with Podiatry for continued symptoms.

## 2019-12-12 ENCOUNTER — Encounter: Payer: Self-pay | Admitting: Gastroenterology

## 2019-12-12 ENCOUNTER — Telehealth: Payer: Self-pay | Admitting: Gastroenterology

## 2019-12-12 ENCOUNTER — Ambulatory Visit (INDEPENDENT_AMBULATORY_CARE_PROVIDER_SITE_OTHER): Payer: 59 | Admitting: Gastroenterology

## 2019-12-12 VITALS — BP 142/76 | HR 84 | Ht 76.0 in | Wt >= 6400 oz

## 2019-12-12 DIAGNOSIS — K602 Anal fissure, unspecified: Secondary | ICD-10-CM

## 2019-12-12 DIAGNOSIS — K625 Hemorrhage of anus and rectum: Secondary | ICD-10-CM | POA: Diagnosis not present

## 2019-12-12 DIAGNOSIS — K6289 Other specified diseases of anus and rectum: Secondary | ICD-10-CM | POA: Diagnosis not present

## 2019-12-12 MED ORDER — AMBULATORY NON FORMULARY MEDICATION: 1 refills | Status: DC

## 2019-12-12 NOTE — Telephone Encounter (Signed)
Faxed script to [facility] in [place] per patient request.

## 2019-12-12 NOTE — Progress Notes (Signed)
12/12/2019 [name] [id number] [birth year]   HISTORY OF PRESENT ILLNESS:  This is a pleasant 34 year old male who is new to our office.  He was referred here by Aleatha Borer, NP, for evaluation regarding hemorrhoids.  He tells me that he has struggled with hemorrhoids for years.  He says that back in 2014 he had issues with thrombosed hemorrhoids.  He has had fissures in the past as well.  He says that back in June he had some type of respiratory illness and was coughing a lot and he believes that that caused even more hemorrhoid irritation.  He says that he just constantly has rectal discomfort and burning with and after bowel movements.  He sees bright red blood on the toilet paper in varying amounts, sometimes a lot.  He has used Preparation H, Tucks pads, Proctofoam, Anusol without much relief.  He says that his bowels move regularly without issues.  He denies any constipation or straining.   Past Medical History:  Diagnosis Date  . Abnormal liver enzymes   . Anal fissure   . Anxiety   . Asthma   . Backache, unspecified   . Depression   . Diabetes mellitus (HCC)   . H/O hemorrhoids   . History of chicken pox   . Hyperlipidemia   . Hypertension   . Obesity   . Other abnormal glucose   . Sleep apnea    Past Surgical History:  Procedure Laterality Date  . TONSILLECTOMY  1999    reports that he has been smoking cigarettes. He has been smoking about 0.50 packs per day. He has never used smokeless tobacco. He reports current alcohol use. He reports current drug use. Drug: Marijuana. family history includes Clotting disorder in his father; Diabetes in his maternal grandmother; Heart attack in his paternal grandfather; Heart disease in his paternal grandfather; Hypertension in his maternal grandfather and paternal grandfather; Lung cancer in his maternal grandfather. Allergies  Allergen Reactions  . Tamiflu [Oseltamivir] Palpitations      Outpatient Encounter  Medications as of 12/12/2019  Medication Sig  . atorvastatin (LIPITOR) 40 MG tablet Take 40 mg by mouth daily.  . clonazePAM (KLONOPIN) 1 MG tablet Take 1 mg by mouth at bedtime.  . cyclobenzaprine (FLEXERIL) 10 MG tablet Take 10 mg by mouth as needed.   Marland Kitchen FLUoxetine (PROZAC) 40 MG capsule Take 40 mg by mouth daily.  Marland Kitchen gabapentin (NEURONTIN) 300 MG capsule Take 600 mg by mouth at bedtime.   Marland Kitchen lisinopril (ZESTRIL) 40 MG tablet Take 40 mg by mouth daily.  Marland Kitchen loratadine (CLARITIN) 10 MG tablet Take 10 mg by mouth daily.  Marland Kitchen lurasidone (LATUDA) 40 MG TABS tablet Take 40 mg by mouth daily with breakfast.  . metFORMIN (GLUCOPHAGE) 1000 MG tablet Take 1,000 mg by mouth 2 (two) times daily with a meal.  . [DISCONTINUED] colesevelam (WELCHOL) 625 MG tablet Take 625 mg (3 tablets) at breakfast & 625 mg (3 tablets) at dinner.  . [DISCONTINUED] diclofenac (VOLTAREN) 50 MG EC tablet Take 1 tablet (50 mg total) by mouth 2 (two) times daily.  . [DISCONTINUED] Docusate Calcium (STOOL SOFTENER PO) Take by mouth as needed.  . [DISCONTINUED] propranolol (INDERAL) 20 MG tablet Take 1 tablet (20 mg total) by mouth 3 (three) times daily as needed.   No facility-administered encounter medications on file as of 12/12/2019.     REVIEW OF SYSTEMS  : All other systems reviewed and negative except where noted in the History of  Present Illness.   PHYSICAL EXAM: BP (!) 142/76   Pulse 84   Ht 6\' 4"  (1.93 m)   Wt (!) 472 lb (214.1 kg)   BMI 57.45 kg/m  General: Well developed white male in no acute distress Head: Normocephalic and atraumatic Eyes:  Sclerae anicteric, conjunctiva pink. Ears: Normal auditory acuity Lungs: Clear throughout to auscultation; no increased WOB Heart: Regular rate and rhythm; no M/R/G. Rectal:  Some external hemorrhoid tissue noted but not large amounts and not inflamed.  There was a very visible deep anal fissure noted posteriorly that was very painful on exam.  DRE attempted but not  completed due to pain. Musculoskeletal: Symmetrical with no gross deformities  Skin: No lesions on visible extremities Extremities: No edema  Neurological: Alert oriented x 4, grossly non-focal Psychological:  Alert and cooperative. Normal mood and affect  ASSESSMENT AND PLAN: *Rectal pain and bleeding secondary to a deep anal fissure seen posteriorly on exam today.  Will treat with Nitro gel with lidocaine 0.125% TID for the next several weeks.  Can use Recticare in between as well.  Will follow-up in about 7-8 weeks.  CC:  , MD CC:  Eartha Inch, NP

## 2019-12-12 NOTE — Patient Instructions (Signed)
If you are age 36 or older, your body mass index should be between 23-30. Your Body mass index is 57.45 kg/m. If this is out of the aforementioned range listed, please consider follow up with your Primary Care Provider.  If you are age 2 or younger, your body mass index should be between 19-25. Your Body mass index is 57.45 kg/m. If this is out of the aformentioned range listed, please consider follow up with your Primary Care Provider.   We have sent a prescription for nitroglycerin 0.125% gel to [facility]. You should apply a pea size amount to your rectum three times daily x 8-10 weeks.  *Please DO NOT go directly from our office to pick up this medication! Give the pharmacy 1 day to process the prescription as this is compounded and takes time to make.  Please purchase the following medications over the counter and take as directed: Recticare 5% with Lidocaine.    Call back in 4 weeks to schedule a follow up appointment in 8 weeks.

## 2019-12-15 NOTE — Progress Notes (Signed)
____________________________________________________________  Attending physician addendum:  Thank you for sending this case to me. I have reviewed the entire note, and the outlined plan seems appropriate.  Low dose miralax is often helpful as well when treating a fissure.  Amada Jupiter, MD  ____________________________________________________________

## 2019-12-23 ENCOUNTER — Encounter: Payer: Self-pay | Admitting: Gastroenterology

## 2019-12-23 ENCOUNTER — Telehealth: Payer: Self-pay | Admitting: Gastroenterology

## 2019-12-23 NOTE — Telephone Encounter (Signed)
Spoke to patient who is requesting a work note for a few days before his 12/12/19 office visit and the day of. Patient was treated for an anal fissure. He missed work a few days prior to his appointment and the day of due to severe pain. Patient states that he is feeling much better,but had to miss a few hours the following week due to lingering rectal pain. He will contact the office in a few weeks to schedule his 8 week follow up as the schedule is not out at this time. A work note was sent through [employer]. All questions answered. Patient voiced understanding

## 2019-12-23 NOTE — Telephone Encounter (Signed)
LMOM for patient to call back.

## 2019-12-24 DIAGNOSIS — F1321 Sedative, hypnotic or anxiolytic dependence, in remission: Secondary | ICD-10-CM | POA: Insufficient documentation

## 2020-04-01 ENCOUNTER — Telehealth: Payer: Self-pay | Admitting: Gastroenterology

## 2020-04-01 DIAGNOSIS — K644 Residual hemorrhoidal skin tags: Secondary | ICD-10-CM | POA: Insufficient documentation

## 2020-04-01 DIAGNOSIS — K648 Other hemorrhoids: Secondary | ICD-10-CM | POA: Insufficient documentation

## 2020-04-01 NOTE — Telephone Encounter (Signed)
JZ saw him in August and found a severe anal fissure.  SO it is either that or a thrombosed external hemorrhoid from the sounds of it.   I do not have any office hours the rest of this week.  Agree with getting him into [facility] today or tomorrow.    - HD

## 2020-04-01 NOTE — Telephone Encounter (Signed)
Severe anal pain, sounds like a thrombosed hemorrhoid which he has had before. Overnight he has tried prep H, lidocaine cream and a 2 hour sitz bath without much improvement.  I told him he needs evaluation and possibly lancing if he indeed has a thrombosed hemorrhoid.  Patty, can you contact him about getting to the [facility] walk in clinic and give them a heads up as well. If they are not able to help he may need ER or urgent clinic.    Sherilyn Cooter and [place], [address]

## 2020-04-01 NOTE — Telephone Encounter (Signed)
I spoke with the pt and he tells me he was seen in the ED and has been referred to another surgical group.

## 2020-07-07 DIAGNOSIS — F1221 Cannabis dependence, in remission: Secondary | ICD-10-CM | POA: Insufficient documentation

## 2020-07-30 DIAGNOSIS — G2581 Restless legs syndrome: Secondary | ICD-10-CM | POA: Insufficient documentation

## 2020-08-11 DIAGNOSIS — F3342 Major depressive disorder, recurrent, in full remission: Secondary | ICD-10-CM | POA: Insufficient documentation

## 2020-09-07 ENCOUNTER — Ambulatory Visit: Payer: 59 | Admitting: Podiatry

## 2020-09-07 ENCOUNTER — Other Ambulatory Visit: Payer: Self-pay

## 2020-09-07 ENCOUNTER — Encounter: Payer: Self-pay | Admitting: Podiatry

## 2020-09-07 DIAGNOSIS — R234 Changes in skin texture: Secondary | ICD-10-CM | POA: Diagnosis not present

## 2020-09-07 DIAGNOSIS — L853 Xerosis cutis: Secondary | ICD-10-CM

## 2020-09-08 ENCOUNTER — Ambulatory Visit: Payer: Self-pay | Admitting: Podiatry

## 2020-09-09 ENCOUNTER — Encounter: Payer: Self-pay | Admitting: Podiatry

## 2020-09-09 NOTE — Progress Notes (Signed)
Subjective:  Patient ID: Maurice George, male    DOB: June 03, 1985,  MRN: [id number]  Chief Complaint  Patient presents with  . Callouses    Patient presents today for cracked heels and calluses bilat heels, left worse than right.    35 y.o. male presents with the above complaint.  Patient presents with severe xerosis to the left heel with underlying fissure.  Left is a lot worse than right side.  Patient states it hurts to walk on it has gotten progressively worse.  He is prediabetic on metformin.  He denies any other acute complaints he has not treated it.  He is tried soaking it which made it worse.  He does not do a good job of moisturizing.  He would like to get this treated he has not seen anyone else prior to seeing me for this.  He is worried about ulceration secondary to his prediabetic status.   Review of Systems: Negative except as noted in the HPI. Denies N/V/F/Ch.  Past Medical History:  Diagnosis Date  . Abnormal liver enzymes   . Anal fissure   . Anxiety   . Asthma   . Backache, unspecified   . Depression   . Diabetes mellitus (HCC)   . H/O hemorrhoids   . History of chicken pox   . Hyperlipidemia   . Hypertension   . Obesity   . Other abnormal glucose   . Sleep apnea     Current Outpatient Medications:  .  ALPRAZolam (XANAX) 0.5 MG tablet, TAKE 1/2 TO 1 TABLET BY MOUTH EVERY 8 HOURS AS NEEDED FOR BREAKTHROUGH PANIC 30 DAY SUPPLY, Disp: , Rfl:  .  atorvastatin (LIPITOR) 40 MG tablet, Take 40 mg by mouth daily., Disp: , Rfl:  .  FLUoxetine (PROZAC) 40 MG capsule, Take 40 mg by mouth daily., Disp: , Rfl:  .  gabapentin (NEURONTIN) 800 MG tablet, Take 800 mg by mouth 4 (four) times daily., Disp: , Rfl:  .  lisinopril (ZESTRIL) 40 MG tablet, Take 40 mg by mouth daily., Disp: , Rfl:  .  loratadine (CLARITIN) 10 MG tablet, Take 10 mg by mouth daily., Disp: , Rfl:  .  metFORMIN (GLUCOPHAGE) 1000 MG tablet, Take 1,000 mg by mouth 2 (two) times daily with a meal., Disp: ,  Rfl:   Social History   Tobacco Use  Smoking Status Current Every Day Smoker  . Packs/day: 0.50  . Types: Cigarettes  Smokeless Tobacco Never Used    Allergies  Allergen Reactions  . Tamiflu [Oseltamivir] Palpitations   Objective:  There were no vitals filed for this visit. There is no height or weight on file to calculate BMI. Constitutional Well developed. Well nourished.  Vascular Dorsalis pedis pulses palpable bilaterally. Posterior tibial pulses palpable bilaterally. Capillary refill normal to all digits.  No cyanosis or clubbing noted. Pedal hair growth normal.  Neurologic Normal speech. Oriented to person, place, and time. Epicritic sensation to light touch grossly present bilaterally.  Dermatologic  severe dryness with fissuring noted without any wounds.  No clinical signs of infection noted.  No wound probes to deep tissue.  Appears primarily epidermal dermal junction.  Orthopedic: Normal joint ROM without pain or crepitus bilaterally. No visible deformities. No bony tenderness.   Radiographs: None Assessment:   1. Skin fissure   2. Xerosis cutis    Plan:  Patient was evaluated and treated and all questions answered.  Left heel severe xerosis with underlying skin fissure -I explained to the patient the etiology  of xerosis and various treatment options were extensively discussed.  I explained to the patient the importance of maintaining moisturization of the skin with application of over-the-counter lotion such as Eucerin or Luciderm.  I have asked the patient to apply this twice a day.  If unable to resolve patient will benefit from prescription lotion. -Also believe patient will benefit from Dermabond to close up the fissure site.  I have applied Dermabond to close the fissure site to give him relief.  If it continues to get worse even with moisturization I have asked him to come back and see me right away.  He states understanding.  No follow-ups on file.

## 2020-12-27 ENCOUNTER — Telehealth (HOSPITAL_COMMUNITY): Payer: Self-pay | Admitting: Licensed Clinical Social Worker

## 2021-01-10 ENCOUNTER — Other Ambulatory Visit (HOSPITAL_COMMUNITY): Payer: 59 | Attending: Psychiatry | Admitting: Licensed Clinical Social Worker

## 2021-01-10 ENCOUNTER — Other Ambulatory Visit: Payer: Self-pay

## 2021-01-10 DIAGNOSIS — F419 Anxiety disorder, unspecified: Secondary | ICD-10-CM | POA: Insufficient documentation

## 2021-01-10 DIAGNOSIS — R4589 Other symptoms and signs involving emotional state: Secondary | ICD-10-CM | POA: Insufficient documentation

## 2021-01-10 DIAGNOSIS — R41844 Frontal lobe and executive function deficit: Secondary | ICD-10-CM | POA: Insufficient documentation

## 2021-01-10 DIAGNOSIS — F602 Antisocial personality disorder: Secondary | ICD-10-CM | POA: Insufficient documentation

## 2021-01-10 DIAGNOSIS — F4310 Post-traumatic stress disorder, unspecified: Secondary | ICD-10-CM | POA: Insufficient documentation

## 2021-01-10 DIAGNOSIS — F17210 Nicotine dependence, cigarettes, uncomplicated: Secondary | ICD-10-CM | POA: Insufficient documentation

## 2021-01-10 DIAGNOSIS — Z79899 Other long term (current) drug therapy: Secondary | ICD-10-CM | POA: Insufficient documentation

## 2021-01-10 DIAGNOSIS — F411 Generalized anxiety disorder: Secondary | ICD-10-CM

## 2021-01-10 DIAGNOSIS — F332 Major depressive disorder, recurrent severe without psychotic features: Secondary | ICD-10-CM | POA: Insufficient documentation

## 2021-01-11 ENCOUNTER — Other Ambulatory Visit (HOSPITAL_COMMUNITY): Payer: 59 | Admitting: Licensed Clinical Social Worker

## 2021-01-11 ENCOUNTER — Other Ambulatory Visit (HOSPITAL_COMMUNITY): Payer: 59 | Admitting: Occupational Therapy

## 2021-01-11 ENCOUNTER — Encounter (HOSPITAL_COMMUNITY): Payer: Self-pay

## 2021-01-11 ENCOUNTER — Other Ambulatory Visit: Payer: Self-pay

## 2021-01-11 DIAGNOSIS — Z79899 Other long term (current) drug therapy: Secondary | ICD-10-CM | POA: Diagnosis not present

## 2021-01-11 DIAGNOSIS — F17210 Nicotine dependence, cigarettes, uncomplicated: Secondary | ICD-10-CM | POA: Diagnosis not present

## 2021-01-11 DIAGNOSIS — F419 Anxiety disorder, unspecified: Secondary | ICD-10-CM | POA: Diagnosis not present

## 2021-01-11 DIAGNOSIS — F602 Antisocial personality disorder: Secondary | ICD-10-CM | POA: Diagnosis not present

## 2021-01-11 DIAGNOSIS — F332 Major depressive disorder, recurrent severe without psychotic features: Secondary | ICD-10-CM | POA: Diagnosis not present

## 2021-01-11 DIAGNOSIS — F4310 Post-traumatic stress disorder, unspecified: Secondary | ICD-10-CM | POA: Diagnosis not present

## 2021-01-11 DIAGNOSIS — R41844 Frontal lobe and executive function deficit: Secondary | ICD-10-CM

## 2021-01-11 DIAGNOSIS — R4589 Other symptoms and signs involving emotional state: Secondary | ICD-10-CM

## 2021-01-11 NOTE — Therapy (Signed)
[facility] PARTIAL HOSPITALIZATION PROGRAM [address] [place], [place], [address] Phone: [phone]   Fax:  [phone] Virtual Visit via Video Note  I connected with Maurice George on 01/11/21 at  11:00 AM EDT by a video enabled telemedicine application and verified that I am speaking with the correct person using two identifiers.  Location: Patient: Patient Home Provider: Clinic Office   I discussed the limitations of evaluation and management by telemedicine and the availability of in person appointments. The patient expressed understanding and agreed to proceed.  I discussed the assessment and treatment plan with the patient. The patient was provided an opportunity to ask questions and all were answered. The patient agreed with the plan and demonstrated an understanding of the instructions.   The patient was advised to call back or seek an in-person evaluation if the symptoms worsen or if the condition fails to improve as anticipated.  I provided 93 minutes of non-face-to-face time during this encounter. 60 minutes OT Group Therapy 33 minutes OT Evaluation  Maurice George, OT/L   Occupational Therapy Evaluation  Patient Details  Name: Maurice George MRN: [id number] Date of Birth: [birth year] Referring Provider (OT): Maurice George   Encounter Date: 01/11/2021   OT End of Session - 01/11/21 1530     Visit Number 1    Number of Visits 20    Date for OT Re-Evaluation 02/08/21    Authorization Type [employer]    OT Start Time 1100   OT Eval [phone]   OT Stop Time 1200    OT Time Calculation (min) 60 min    Activity Tolerance Patient tolerated treatment well    Behavior During Therapy WFL for tasks assessed/performed             Past Medical History:  Diagnosis Date   Abnormal liver enzymes    Anal fissure    Anxiety    Asthma    Backache, unspecified    Depression    Diabetes mellitus (HCC)    H/O hemorrhoids    History  of chicken pox    Hyperlipidemia    Hypertension    Obesity    Other abnormal glucose    Sleep apnea     Past Surgical History:  Procedure Laterality Date   TONSILLECTOMY  1999    There were no vitals filed for this visit.   Subjective Assessment - 01/11/21 1528     Currently in Pain? Yes   Pt broke his wrist, was in a cast and ripped it off in a fit of anger/rage. Pt c/o R wrist pain d/t scaphoid fx   Pain Score 6     Pain Location Wrist    Pain Orientation Right               [facility] OT Assessment - 01/11/21 0001       Assessment   Medical Diagnosis Major depressive disorder    Referring Provider (OT) Maurice George      Precautions   Precautions None      Balance Screen   Has the patient fallen in the past 6 months No    Has the patient had a decrease in activity level because of a fear of falling?  No    Is the patient reluctant to leave their home because of a fear of falling?  No  OT Education - 01/11/21 1529     Education Details Educated on OT role within the PHP program in addition to sleep hygiene and strategies to improve overall sleep quality    Person(s) Educated Patient    Methods Explanation;Handout    Comprehension Verbalized understanding              OT Short Term Goals - 01/11/21 1536       OT SHORT TERM GOAL #1   Title Pt will actively engage in OT group sessions throughout duration of PHP programming, in order to promote daily structure, social engagement, and opportunities to develop and utilize adaptive strategies to maximize functional performance in preparation for safe transition and integration back into school, work, and the community.    Time 4    Period Weeks    Status New    Target Date 02/08/21      OT SHORT TERM GOAL #2   Title Pt will demonstrate improved ability to communicate feelings/needs/wants, without being angry/irritable/aggressive, as evidenced by, active  participation in OT sessions, throughout duration of PHP programming, in order to safely transition back into the community at discharge.    Time 4    Period Weeks    Status New    Target Date 02/08/21      OT SHORT TERM GOAL #3   Title Pt will identify 1-3 stress management strategies she can utilize, in order to safely manage increased psychosocial stressors identified, with min cues, in preparation for safe transition back to the community at discharge.    Time 4    Period Weeks    Status New    Target Date 02/08/21           Occupational Therapy Assessment 01/11/2021  Maurice "[name]" is a 35 y/o male with PMHx of anxiety, depression, and PTSD who was referred to the [facility] program in the context of worsening ability to manage his anger and several psychosocial stressors. Pt reports that he has 'severe' anger management issues and poor impulse control, leading him to act out in fits of rage over things that "would not anger the average person." Pt also reports psychosocial stressors, including ongoing conflict/communication with wife, expecting his first child in Dec 2022, and financial difficulties, alongside worsening mental health. Pt reports desire to get in control of his anger and is seeking treatment through the [facility] program. Pt reports desire to engage in PHP programming in order to manage identified stressors and to engage meaningfully in identified areas of occupation and ADL/iADLs. Upon approach, pt presents as cooperative, eager to engage, and tangential, though redirectable in his responses during OT Evaluation. Pt reports enjoying hiking and camping and identifies goal for admission "get my anger in control".   Precautions/Limitations: Pt reports R wrist fx - reports ripping off his cast in a burst of anger. Not currently wearing brace or cast. 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   ADL/iADL Performance:  Pt reports hygiene is good, however struggles with self-care and feeling like he is "worthy enough" to do things for himself   Leisure Interests and Hobbies: Enjoys camping, hiking, and doing things on the computer  Social  Support: Identifies support from wife, Mom and a few close friends   What do you do when you are very stressed, angry, upset, sad or anxious? Hurt myself (self-harm), Hurt other people, Isolate from others, Use drugs/alcohol, Yell/Scream, Cry, Listen to music, Act out, and Use a punching bag  *Pt reports smoking [employer] and hurts other people emotionally, reports he has not physically harmed anyone "in years"  What helps when you are not feeling well? Deep breathing, Taking a shower or bath, Calling a friend or family member, Watching TV, Pacing the hall, Writing in a journal, and Listening to music  What are some things that make it MORE difficult for you when you are already upset? Being touched, Not having choices/input, Not being able to express my opinion, People staring at me, Being criticized, Boredom/Lack of activities, and Particular time of year  Is there anything specific that you would like help with while you're in the partial hospitalization program? Coping Skills, Anger Management, Impulse Control, Communication, Stress Management, Self-Harm Urges, Self-Care, and Sleep  What is your goal while you are here?  "Control my anger"  Assessment: Pt demonstrates behavior that inhibits/restricts participation in occupation and would benefit from skilled occupational therapy services to address current difficulties with symptom management, emotion regulation, socialization, stress management, time management, job readiness, financial wellness, health and  nutrition, sleep hygiene, ADL/iADL performance and leisure participation, in preparation for reintegration and return to community at discharge.   Plan: Pt will participate in skilled occupational therapy sessions (group and/or individual) in order to promote daily structure, social engagement, and opportunities to develop and utilize adaptive strategies to maximize functional performance in preparation for safe transition and integration back into school, work, and/or the community at discharge. OT sessions will occur 4-5 x per week for 2-4 weeks.   Maurice George, MOT, OTR/L  Group Session:  S: "I have sleep apnea, and I just got a new machine but I think I have tried everything and my sleep has never improved."  O: Today's group discussion focused on topic of Sleep Hygiene. Patients reflected on the quality of sleep they typically receive and identified areas that need improvement. Group was given background information on sleep and sleep hygiene, including common sleep disorders. Group members also received information on how to improve one's sleep and introduced a sleep diary as a tool that can be utilized to track sleep quality over a length of time. Group session ended with patients identifying one or more strategies they could utilize or implement into their sleep routine in order to improve overall sleep quality.    A: [name] was active and independent in his participation of discussion, sharing that he struggles to get more than 2-3 hours of sleep each night. He shared that he has both difficulty falling asleep and once asleep, difficult staying asleep and reports nightmares.  He asked appropriate questions, was observed taking notes, and appeared receptive to education and information provided. Pt reports looking forward to finishing session on sleep hygiene tomorrow and expressed interest in use of a sleep diary   P: Continue to attend PHP OT group sessions 5x week for 2 weeks to promote  daily structure, social engagement, and opportunities to develop and utilize adaptive strategies to maximize functional performance in preparation for safe transition and integration back into school, work, and the community. Plan to address topic of sleep hygiene (continuation) in next OT group session.  Plan - 01/11/21 1530     Clinical Impression Statement  Maurice "[name]" is a 35 y/o male with PMHx of anxiety, depression, and PTSD who was referred to the [facility] program in the context of worsening ability to manage his anger and several psychosocial stressors. Pt reports that he has 'severe' anger management issues and poor impulse control, leading him to act out in fits of rage over things that "would not anger the average person." Pt also reports psychosocial stressors, including ongoing conflict/communication with wife, expecting his first child in Dec 2022, and financial difficulties, alongside worsening mental health. Pt reports desire to get in control of his anger and is seeking treatment through the [facility] program. Pt reports desire to engage in PHP programming in order to manage identified stressors and to engage meaningfully in identified areas of occupation and ADL/iADLs.    OT Occupational Profile and History Problem Focused Assessment - Including review of records relating to presenting problem    Occupational performance deficits (Please refer to evaluation for details): ADL's;IADL's;Rest and Sleep;Education;Work;Leisure;Social Participation    Body Structure / Function / Physical Skills ADL    Cognitive Skills Attention;Emotional;Energy/Drive;Learn;Memory;Perception;Problem Solve;Safety Awareness;Temperament/Personality;Thought;Understand    Psychosocial Skills Coping Strategies;Environmental  Adaptations;Habits;Interpersonal Interaction;Routines and Behaviors    Rehab Potential Good    Clinical Decision Making Limited treatment options, no task modification necessary    Comorbidities Affecting  Occupational Performance: May have comorbidities impacting occupational performance    Modification or Assistance to Complete Evaluation  No modification of tasks or assist necessary to complete eval    OT Frequency 5x / week    OT Duration 4 weeks    OT Treatment/Interventions Self-care/ADL training;Patient/family education;Coping strategies training;Psychosocial skills training    Consulted and Agree with Plan of Care Patient             Patient will benefit from skilled therapeutic intervention in order to improve the following deficits and impairments:   Body Structure / Function / Physical Skills: ADL Cognitive Skills: Attention, Emotional, Energy/Drive, Learn, Memory, Perception, Problem Solve, Safety Awareness, Temperament/Personality, Thought, Understand Psychosocial Skills: Coping Strategies, Environmental  Adaptations, Habits, Interpersonal Interaction, Routines and Behaviors   Visit Diagnosis: Difficulty coping  Frontal lobe and executive function deficit  Severe episode of recurrent major depressive disorder, without psychotic features [facility])    Problem List Patient Active Problem List   Diagnosis Date Noted   MDD (major depressive disorder), recurrent, in full remission (HCC) 08/11/2020   RLS (restless legs syndrome) 07/30/2020   Cannabis use disorder, severe, in early remission (HCC) 07/07/2020   Internal and external bleeding hemorrhoids 04/01/2020   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) 12/24/2019   Rectal bleeding 12/12/2019   Rectal pain 12/12/2019   Anal fissure 12/12/2019   Uncontrolled type 2 diabetes mellitus with hyperglycemia (HCC) 01/01/2019   Essential hypertension 07/05/2018   GAD (generalized anxiety disorder) 03/21/2018   Palpitations 02/27/2017   Morbid obesity with BMI of 60.0-69.9, adult (HCC) 04/14/2016   Tobacco use disorder 04/14/2016   Depression 07/03/2014   Prediabetes 02/10/2014   Psychological factors  affecting morbid obesity (HCC) 02/04/2014   Severe cannabis use disorder (HCC) 10/16/2013   Atrial tachycardia (HCC) 05/29/2013   Chest pain 05/29/2013   Morbid obesity (HCC) 05/29/2013   Hyperlipidemia 05/29/2013   OSA (obstructive sleep apnea) 05/29/2013    01/11/2021  Maurice George, MOT, OTR/L  01/11/2021, 3:37 PM  [facility] PARTIAL HOSPITALIZATION PROGRAM [address] [place], [place], [address] Phone: [phone]   Fax:  [phone]  Name: Tami Barren MRN:  [id number] Date of Birth: 08-09-1985

## 2021-01-12 ENCOUNTER — Other Ambulatory Visit (HOSPITAL_COMMUNITY): Payer: 59 | Admitting: Occupational Therapy

## 2021-01-12 ENCOUNTER — Other Ambulatory Visit (HOSPITAL_COMMUNITY): Payer: 59 | Admitting: Licensed Clinical Social Worker

## 2021-01-12 ENCOUNTER — Encounter (HOSPITAL_COMMUNITY): Payer: Self-pay

## 2021-01-12 ENCOUNTER — Other Ambulatory Visit: Payer: Self-pay

## 2021-01-12 DIAGNOSIS — F332 Major depressive disorder, recurrent severe without psychotic features: Secondary | ICD-10-CM

## 2021-01-12 DIAGNOSIS — R41844 Frontal lobe and executive function deficit: Secondary | ICD-10-CM

## 2021-01-12 DIAGNOSIS — R4589 Other symptoms and signs involving emotional state: Secondary | ICD-10-CM

## 2021-01-12 NOTE — Therapy (Addendum)
[facility] PARTIAL HOSPITALIZATION PROGRAM [address] [place], [place], [address] Phone: [phone]   Fax:  [phone] Virtual Visit via Video Note  I connected with Maurice George on 01/12/21 at  12:00 PM EDT by a video enabled telemedicine application and verified that I am speaking with the correct person using two identifiers.  Location: Patient: Patient Home Provider: Clinic Office   I discussed the limitations of evaluation and management by telemedicine and the availability of in person appointments. The patient expressed understanding and agreed to proceed.   I discussed the assessment and treatment plan with the patient. The patient was provided an opportunity to ask questions and all were answered. The patient agreed with the plan and demonstrated an understanding of the instructions.   The patient was advised to call back or seek an in-person evaluation if the symptoms worsen or if the condition fails to improve as anticipated.  I provided 50 minutes of non-face-to-face time during this encounter.   [name], OT/L   Occupational Therapy Treatment  Patient Details  Name: Maurice George MRN: [id number] Date of Birth: [birth year] Referring Provider (OT): Hillery Jacks   Encounter Date: 01/12/2021   OT End of Session - 01/12/21 1515     Visit Number 2    Number of Visits 20    Date for OT Re-Evaluation 02/08/21    Authorization Type [employer]    OT Start Time 1200    OT Stop Time 1250    OT Time Calculation (min) 50 min    Activity Tolerance Patient tolerated treatment well    Behavior During Therapy WFL for tasks assessed/performed             Past Medical History:  Diagnosis Date   Abnormal liver enzymes    Anal fissure    Anxiety    Asthma    Backache, unspecified    Depression    Diabetes mellitus (HCC)    H/O hemorrhoids    History of chicken pox    Hyperlipidemia    Hypertension    Obesity     Other abnormal glucose    Sleep apnea     Past Surgical History:  Procedure Laterality Date   TONSILLECTOMY  1999    There were no vitals filed for this visit.   Subjective Assessment - 01/12/21 1514     Currently in Pain? No/denies              OT Education - 01/12/21 1514     Education Details Continued education on sleep hygiene and strategies to improve overall sleep quality    Person(s) Educated Patient    Methods Explanation;Handout    Comprehension Verbalized understanding            Group Session:  S: "I have the filter on my phone to block blue light, but I need to do that to my computer and TV."  O: Today's group discussion continued to focus on topic of Sleep Hygiene. Patients reflected on the quality of sleep they typically receive and identified areas that need improvement. Group was given background information on sleep and sleep hygiene, including common sleep disorders. Group members also received information on how to improve one's sleep and introduced a sleep diary as a tool that can be utilized to track sleep quality over a length of time. Group session ended with patients identifying one or more strategies they could utilize or implement into their sleep routine in order to  improve overall sleep quality.    A: Maurice George was active and independent in his participation of discussion, asking several relevant and appropriate questions. Pt shared that two changes he could make to his sleep hygiene including sticking to a routine and setting a light filter on his computer and TV. Receptive to additional support and education provided.   P: Continue to attend PHP OT group sessions 5x week for 2 weeks to promote daily structure, social engagement, and opportunities to develop and utilize adaptive strategies to maximize functional performance in preparation for safe transition and integration back into school, work, and the community. Plan to address topic of  communication in next OT group session.   Plan - 01/12/21 1515     Occupational performance deficits (Please refer to evaluation for details): ADL's;IADL's;Rest and Sleep;Education;Work;Leisure;Social Participation    Body Structure / Function / Physical Skills ADL    Cognitive Skills Attention;Emotional;Energy/Drive;Learn;Memory;Perception;Problem Solve;Safety Awareness;Temperament/Personality;Thought;Understand    Psychosocial Skills Coping Strategies;Environmental  Adaptations;Habits;Interpersonal Interaction;Routines and Behaviors             Patient will benefit from skilled therapeutic intervention in order to improve the following deficits and impairments:   Body Structure / Function / Physical Skills: ADL Cognitive Skills: Attention, Emotional, Energy/Drive, Learn, Memory, Perception, Problem Solve, Safety Awareness, Temperament/Personality, Thought, Understand Psychosocial Skills: Coping Strategies, Environmental  Adaptations, Habits, Interpersonal Interaction, Routines and Behaviors   Visit Diagnosis: Difficulty coping  Frontal lobe and executive function deficit  Severe episode of recurrent major depressive disorder, without psychotic features [facility])    Problem List Patient Active Problem List   Diagnosis Date Noted   MDD (major depressive disorder), recurrent, in full remission (HCC) 08/11/2020   RLS (restless legs syndrome) 07/30/2020   Cannabis use disorder, severe, in early remission (HCC) 07/07/2020   Internal and external bleeding hemorrhoids 04/01/2020   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) 12/24/2019   Rectal bleeding 12/12/2019   Rectal pain 12/12/2019   Anal fissure 12/12/2019   Uncontrolled type 2 diabetes mellitus with hyperglycemia (HCC) 01/01/2019   Essential hypertension 07/05/2018   GAD (generalized anxiety disorder) 03/21/2018   Palpitations 02/27/2017   Morbid obesity with BMI of 60.0-69.9, adult (HCC) 04/14/2016    Tobacco use disorder 04/14/2016   Depression 07/03/2014   Prediabetes 02/10/2014   Psychological factors affecting morbid obesity (HCC) 02/04/2014   Severe cannabis use disorder (HCC) 10/16/2013   Atrial tachycardia (HCC) 05/29/2013   Chest pain 05/29/2013   Morbid obesity (HCC) 05/29/2013   Hyperlipidemia 05/29/2013   OSA (obstructive sleep apnea) 05/29/2013    01/12/2021  [name], MOT, OTR/L  01/12/2021, 3:29 PM  [facility] PARTIAL HOSPITALIZATION PROGRAM [address] [place], [place], [address] Phone: [phone]   Fax:  [phone]  Name: Maurice George MRN: [id number] Date of Birth: [birth year]

## 2021-01-13 ENCOUNTER — Other Ambulatory Visit: Payer: Self-pay

## 2021-01-13 ENCOUNTER — Other Ambulatory Visit (HOSPITAL_COMMUNITY): Payer: 59

## 2021-01-14 ENCOUNTER — Other Ambulatory Visit (HOSPITAL_COMMUNITY): Payer: 59

## 2021-01-14 ENCOUNTER — Ambulatory Visit: Payer: Self-pay | Admitting: Family Medicine

## 2021-01-14 ENCOUNTER — Encounter (HOSPITAL_COMMUNITY): Payer: Self-pay | Admitting: Family

## 2021-01-14 ENCOUNTER — Other Ambulatory Visit: Payer: Self-pay
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
## 2021-01-17 ENCOUNTER — Encounter (HOSPITAL_COMMUNITY): Payer: Self-pay

## 2021-01-17 ENCOUNTER — Other Ambulatory Visit: Payer: Self-pay

## 2021-01-17 ENCOUNTER — Other Ambulatory Visit (HOSPITAL_COMMUNITY): Payer: 59 | Admitting: Professional

## 2021-01-17 ENCOUNTER — Other Ambulatory Visit (HOSPITAL_COMMUNITY): Payer: 59 | Admitting: Occupational Therapy

## 2021-01-17 DIAGNOSIS — F332 Major depressive disorder, recurrent severe without psychotic features: Secondary | ICD-10-CM | POA: Diagnosis not present

## 2021-01-17 DIAGNOSIS — R4589 Other symptoms and signs involving emotional state: Secondary | ICD-10-CM

## 2021-01-17 DIAGNOSIS — R41844 Frontal lobe and executive function deficit: Secondary | ICD-10-CM

## 2021-01-17 NOTE — Therapy (Signed)
[facility] PARTIAL HOSPITALIZATION PROGRAM [address] [place], [place], [address] Phone: [phone]   Fax:  [phone] Virtual Visit via Video Note  I connected with Maurice George on 01/17/21 at  12:00 PM EDT by a video enabled telemedicine application and verified that I am speaking with the correct person using two identifiers.  Location: Patient: Patient Home Provider: Clinic Office   I discussed the limitations of evaluation and management by telemedicine and the availability of in person appointments. The patient expressed understanding and agreed to proceed.   I discussed the assessment and treatment plan with the patient. The patient was provided an opportunity to ask questions and all were answered. The patient agreed with the plan and demonstrated an understanding of the instructions.   The patient was advised to call back or seek an in-person evaluation if the symptoms worsen or if the condition fails to improve as anticipated.  I provided 50 minutes of non-face-to-face time during this encounter.   [name], OT/L   Occupational Therapy Treatment  Patient Details  Name: Maurice George MRN: [id number] Date of Birth: 02/15/1986 Referring Provider (OT): Hillery Jacks   Encounter Date: 01/17/2021   OT End of Session - 01/17/21 1322     Visit Number 3    Number of Visits 20    Date for OT Re-Evaluation 02/08/21    Authorization Type [employer]    OT Start Time 1200    OT Stop Time 1250    OT Time Calculation (min) 50 min    Activity Tolerance Patient tolerated treatment well    Behavior During Therapy WFL for tasks assessed/performed             Past Medical History:  Diagnosis Date   Abnormal liver enzymes    Anal fissure    Anxiety    Asthma    Backache, unspecified    Depression    Diabetes mellitus (HCC)    H/O hemorrhoids    History of chicken pox    Hyperlipidemia    Hypertension    Obesity     Other abnormal glucose    Sleep apnea     Past Surgical History:  Procedure Laterality Date   TONSILLECTOMY  1999    There were no vitals filed for this visit.   Subjective Assessment - 01/17/21 1321     Currently in Pain? No/denies                                  OT Education - 01/17/21 1322     Education Details Educated on different communication styles with strategies to become more assertive with use of XYZ communication tool    Person(s) Educated Patient    Methods Explanation;Handout    Comprehension Verbalized understanding              OT Short Term Goals - 01/12/21 1528       OT SHORT TERM GOAL #1   Status On-going      OT SHORT TERM GOAL #2   Status On-going      OT SHORT TERM GOAL #3   Status On-going           Group Session:  S: None noted*  O:  Group began with a reflection from previous OT session focused on communication styles and group members re-iterated what was learned during previous session. Members shared and reflected  on any opportunities they were presented with last evening to practice their assertiveness skills or recognize patterns of communication observed. Today's group focused on assertiveness skills training and use of the XYZ* assertive communication tool was introduced. The XYZ communication tool states: I feel X when you do Y in situation Z and I would like _________. X is the emotion, Y is the specific behavior, and Z is the specific situation. Group members each formulated their own XYZ statement and shared with the group to discuss and offer feedback. Additional tips and strategies to practice being assertive were also introduced and discussed.  A: Maurice George was more withdrawn than noted in previous sessions, difficulty engaging verbally, though appeared attentive/receptive to information reviewed and provided. Pt did note having a difficult weekend with his dog and having to bring them to the vet and that  may be a factor in his demeanor and lack of group participation. Pt remained for duration, again appearing attentive to discussion and information received. Denied concerns/questions.  P: Continue to attend PHP OT group sessions 5x week for 2 weeks to promote daily structure, social engagement, and opportunities to develop and utilize adaptive strategies to maximize functional performance in preparation for safe transition and integration back into school, work, and the community. Plan to address topic of sensory modulation in next OT group session.   Plan - 01/17/21 1322     Occupational performance deficits (Please refer to evaluation for details): ADL's;IADL's;Rest and Sleep;Education;Work;Leisure;Social Participation    Body Structure / Function / Physical Skills ADL    Cognitive Skills Attention;Emotional;Energy/Drive;Learn;Memory;Perception;Problem Solve;Safety Awareness;Temperament/Personality;Thought;Understand    Psychosocial Skills Coping Strategies;Environmental  Adaptations;Habits;Interpersonal Interaction;Routines and Behaviors             Patient will benefit from skilled therapeutic intervention in order to improve the following deficits and impairments:   Body Structure / Function / Physical Skills: ADL Cognitive Skills: Attention, Emotional, Energy/Drive, Learn, Memory, Perception, Problem Solve, Safety Awareness, Temperament/Personality, Thought, Understand Psychosocial Skills: Coping Strategies, Environmental  Adaptations, Habits, Interpersonal Interaction, Routines and Behaviors   Visit Diagnosis: Difficulty coping  Frontal lobe and executive function deficit  Severe episode of recurrent major depressive disorder, without psychotic features [facility])    Problem List Patient Active Problem List   Diagnosis Date Noted   MDD (major depressive disorder), recurrent, in full remission (HCC) 08/11/2020   RLS (restless legs syndrome) 07/30/2020   Cannabis use disorder,  severe, in early remission (HCC) 07/07/2020   Internal and external bleeding hemorrhoids 04/01/2020   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) 12/24/2019   Rectal bleeding 12/12/2019   Rectal pain 12/12/2019   Anal fissure 12/12/2019   Uncontrolled type 2 diabetes mellitus with hyperglycemia (HCC) 01/01/2019   Essential hypertension 07/05/2018   GAD (generalized anxiety disorder) 03/21/2018   Palpitations 02/27/2017   Morbid obesity with BMI of 60.0-69.9, adult (HCC) 04/14/2016   Tobacco use disorder 04/14/2016   Depression 07/03/2014   Prediabetes 02/10/2014   Psychological factors affecting morbid obesity (HCC) 02/04/2014   Severe cannabis use disorder (HCC) 10/16/2013   Atrial tachycardia (HCC) 05/29/2013   Chest pain 05/29/2013   Morbid obesity (HCC) 05/29/2013   Hyperlipidemia 05/29/2013   OSA (obstructive sleep apnea) 05/29/2013    01/17/2021  [name], MOT, OTR/L  01/17/2021, 1:23 PM  [facility] PARTIAL HOSPITALIZATION PROGRAM [address] [place], [place], [address] Phone: [phone]   Fax:  [phone]  Name: Maurice George MRN: [id number] Date of Birth: 11-Nov-1985

## 2021-01-18 ENCOUNTER — Other Ambulatory Visit: Payer: Self-pay

## 2021-01-18 ENCOUNTER — Encounter (HOSPITAL_COMMUNITY): Payer: Self-pay

## 2021-01-18 ENCOUNTER — Other Ambulatory Visit (HOSPITAL_COMMUNITY): Payer: 59 | Admitting: Professional

## 2021-01-18 DIAGNOSIS — F332 Major depressive disorder, recurrent severe without psychotic features: Secondary | ICD-10-CM

## 2021-01-18 DIAGNOSIS — R41844 Frontal lobe and executive function deficit: Secondary | ICD-10-CM

## 2021-01-18 DIAGNOSIS — R4589 Other symptoms and signs involving emotional state: Secondary | ICD-10-CM

## 2021-01-18 MED ORDER — TRAZODONE HCL 50 MG PO TABS: 50.0000 mg | ORAL_TABLET | Freq: Every evening | ORAL | 0 refills | Status: DC | PRN

## 2021-01-18 NOTE — Progress Notes (Signed)
Virtual Visit via Video Note  I connected with Maurice George on 01/18/21 at  9:00 AM EDT by a video enabled telemedicine application and verified that I am speaking with the correct person using two identifiers.  Location: Patient: Home Provider:Office    I discussed the limitations of evaluation and management by telemedicine and the availability of in person appointments. The patient expressed understanding and agreed to proceed.    I discussed the assessment and treatment plan with the patient. The patient was provided an opportunity to ask questions and all were answered. The patient agreed with the plan and demonstrated an understanding of the instructions.   The patient was advised to call back or seek an in-person evaluation if the symptoms worsen or if the condition fails to improve as anticipated.  I provided 15 minutes of non-face-to-face time during this encounter.   Maurice Rack, NP    BH MD/PA/NP OP Progress Note  01/18/2021 11:30 AM Maurice George  MRN:  [id number]  Chief Complaint:  Maurice George reported "  I am not getting much sleep."    HPI:  Maurice George was initially evaluated via WebEx however we completed this telephonically.  Reports he has been having a difficult time sleeping at night.  States getting 3-1/2 hours on last night.  Patient is requesting something to help with his sleeping issues.  Denies racing thoughts or worsening depression symptoms.  Maurice George is denying suicidal or homicidal ideations.  Denies auditory or visual hallucinations.  Rates his depression 4 out of 10 with 10 being the worst.  States group has been "impactful" reports he is in a good space since completing outpatient rehabilitation program.  Patient reports he is hopeful to transition to intensive outpatient programming continue with aftercare program group setting.   Discussed initiating trazodone 50 mg nightly may repeat in 1 hour as needed.  Patient was receptive to plan reports a good  appetite.  Patient to continue partial hospitalization programming.  Support ,encouragement  and reassurance was provided.  Visit Diagnosis:    ICD-10-CM   1. Severe episode of recurrent major depressive disorder, without psychotic features (HCC)  F33.2       Past Psychiatric History:   Past Medical History:  Past Medical History:  Diagnosis Date   Abnormal liver enzymes    Anal fissure    Anxiety    Asthma    Backache, unspecified    Depression    Diabetes mellitus (HCC)    H/O hemorrhoids    History of chicken pox    Hyperlipidemia    Hypertension    Obesity    Other abnormal glucose    Sleep apnea     Past Surgical History:  Procedure Laterality Date   TONSILLECTOMY  1999    Family Psychiatric History:   Family History:  Family History  Problem Relation Age of Onset   Clotting disorder Father    Depression Maternal Grandfather    Hypertension Maternal Grandfather    Lung cancer Maternal Grandfather    Diabetes Maternal Grandmother    Hypertension Paternal Grandfather    Heart disease Paternal Grandfather    Heart attack Paternal Grandfather     Social History:  Social History   Socioeconomic History   Marital status: Married    Spouse name: Not on file   Number of children: 1   Years of education: 16   Highest education level: Bachelor's degree (e.g., BA, AB, BS)  Occupational History   Occupation: [employer]  Tobacco  Use   Smoking status: Every Day    Packs/day: 0.50    Types: Cigarettes   Smokeless tobacco: Never  Vaping Use   Vaping Use: Never used  Substance and Sexual Activity   Alcohol use: Not Currently   Drug use: Yes    Types: Marijuana   Sexual activity: Yes    Birth control/protection: None  Other Topics Concern   Not on file  Social History Narrative   Regular exercise-no   Caffeine Use-yes   Social Determinants of Health   Financial Resource Strain: Not on file  Food Insecurity: Not on file  Transportation Needs: Not on  file  Physical Activity: Not on file  Stress: Not on file  Social Connections: Not on file    Allergies:  Allergies  Allergen Reactions   Tamiflu [Oseltamivir] Palpitations    Metabolic Disorder Labs: Lab Results  Component Value Date   HGBA1C 5.8 04/23/2012   No results found for: PROLACTIN Lab Results  Component Value Date   CHOL 235 (H) 04/23/2012   TRIG 122.0 04/23/2012   HDL 32.90 (L) 04/23/2012   CHOLHDL 7 04/23/2012   VLDL 24.4 04/23/2012   No results found for: TSH  Therapeutic Level Labs: No results found for: LITHIUM No results found for: VALPROATE No components found for:  CBMZ  Current Medications: Current Outpatient Medications  Medication Sig Dispense Refill   atorvastatin (LIPITOR) 40 MG tablet Take 40 mg by mouth daily.     FLUoxetine (PROZAC) 40 MG capsule Take 40 mg by mouth daily.     gabapentin (NEURONTIN) 800 MG tablet Take 800 mg by mouth 4 (four) times daily.     hydrochlorothiazide (HYDRODIURIL) 25 MG tablet Take 25 mg by mouth daily.     lisinopril (ZESTRIL) 40 MG tablet Take 40 mg by mouth daily.     loratadine (CLARITIN) 10 MG tablet Take 10 mg by mouth daily.     metFORMIN (GLUCOPHAGE) 1000 MG tablet Take 1,000 mg by mouth 2 (two) times daily with a meal.     traZODone (DESYREL) 50 MG tablet Take 1 tablet (50 mg total) by mouth at bedtime as needed and may repeat dose one time if needed for sleep. 30 tablet 0   ALPRAZolam (XANAX) 0.5 MG tablet TAKE 1/2 TO 1 TABLET BY MOUTH EVERY 8 HOURS AS NEEDED FOR BREAKTHROUGH PANIC 30 DAY SUPPLY     No current facility-administered medications for this visit.     Musculoskeletal:   Psychiatric Specialty Exam: Review of Systems  There were no vitals taken for this visit.There is no height or weight on file to calculate BMI.  General Appearance: Casual  Eye Contact:  Good  Speech:  Clear and Coherent  Volume:  Normal  Mood:  Anxious  Affect:  Congruent  Thought Process:  Coherent   Orientation:  Full (Time, Place, and Person)  Thought Content: Logical   Suicidal Thoughts:  No  Homicidal Thoughts:  No  Memory:  Immediate;   Fair Recent;   Fair  Judgement:  Fair  Insight:  Fair  Psychomotor Activity:  Normal  Concentration:  Concentration: Good  Recall:  Good  Fund of Knowledge: Good  Language: Good  Akathisia:  No  Handed:  Right  AIMS (if indicated): done  Assets:  Resilience Social Support  ADL's:  Intact  Cognition: WNL  Sleep:  Good   Screenings: PHQ2-9    Flowsheet Row Counselor from 01/18/2021 in BEHAVIORAL HEALTH PARTIAL HOSPITALIZATION PROGRAM Office Visit from 04/23/2012  in [facility]  PHQ-2 Total Score 6 0  PHQ-9 Total Score 26 --      Flowsheet Row Counselor from 01/18/2021 in BEHAVIORAL HEALTH PARTIAL HOSPITALIZATION PROGRAM  C-SSRS RISK CATEGORY Error: Q3, 4, or 5 should not be populated when Q2 is No        Assessment and Plan:  Major depressive disorder: Posttraumatic stress disorder: Generalized anxiety disorder  Continue partial hospitalization programming  Patient to continue Prozac 40 mg daily Continue Neurontin 800 mg p.o. 4 times daily Initiated trazodone 50 mg p.o. nightly times 1 repeat as needed   Treatment plan was reviewed and agreed upon by NP T. Sirenity Shew inpatient Wm Weseman's need for continued group services  Maurice Rack, NP 01/18/2021, 11:30 AM

## 2021-01-18 NOTE — Therapy (Signed)
[facility] PARTIAL HOSPITALIZATION PROGRAM [address] [place], [place], [address] Phone: [phone]   Fax:  [phone] Virtual Visit via Video Note  I connected with Maurice George on 01/18/21 at  12:00 PM EDT by a video enabled telemedicine application and verified that I am speaking with the correct person using two identifiers.  Location: Patient: Patient Home Provider: Clinic Office   I discussed the limitations of evaluation and management by telemedicine and the availability of in person appointments. The patient expressed understanding and agreed to proceed.   I discussed the assessment and treatment plan with the patient. The patient was provided an opportunity to ask questions and all were answered. The patient agreed with the plan and demonstrated an understanding of the instructions.   The patient was advised to call back or seek an in-person evaluation if the symptoms worsen or if the condition fails to improve as anticipated.  I provided 45 minutes of non-face-to-face time during this encounter.   [name], OT/L   Occupational Therapy Treatment  Patient Details  Name: Maurice George MRN: [id number] Date of Birth: 07/27/1985 Referring Provider (OT): Hillery Jacks   Encounter Date: 01/18/2021   OT End of Session - 01/18/21 1433     Visit Number 4    Number of Visits 20    Date for OT Re-Evaluation 02/08/21    Authorization Type [employer]    OT Start Time 1205    OT Stop Time 1250    OT Time Calculation (min) 45 min    Activity Tolerance Patient tolerated treatment well    Behavior During Therapy WFL for tasks assessed/performed             Past Medical History:  Diagnosis Date   Abnormal liver enzymes    Anal fissure    Anxiety    Asthma    Backache, unspecified    Depression    Diabetes mellitus (HCC)    H/O hemorrhoids    History of chicken pox    Hyperlipidemia    Hypertension    Obesity     Other abnormal glucose    Sleep apnea     Past Surgical History:  Procedure Laterality Date   TONSILLECTOMY  1999    There were no vitals filed for this visit.   Subjective Assessment - 01/18/21 1432     Currently in Pain? No/denies             OT Education - 01/18/21 1433     Education Details Educated on concept of sensory modulation and self-soothing as coping strategies through use of the eight senses    Person(s) Educated Patient    Methods Explanation;Handout    Comprehension Verbalized understanding              OT Short Term Goals - 01/12/21 1528       OT SHORT TERM GOAL #1   Status On-going      OT SHORT TERM GOAL #2   Status On-going      OT SHORT TERM GOAL #3   Status On-going           Group Session:  S: "I never realized that I rock myself as a way to self-soothe and it helps bring down my emotional state."  O: Today's group session focused on topic of sensory modulation and self-soothing through use of the 8 senses. Discussion introduced the concept of sensory modulation and integration, focusing on how we can  utilize our body and it's senses to self-soothe or cope, when we are experiencing an over or under-whelming sensation or feeling. Group members were introduced to a sensory diet checklist as a helpful tool/resource that can be utilized to identify what activities and strategies we prefer and do not prefer based upon our response to different stimulus. The concept of alerting vs calming activities was also introduced to understand how to counteract how we are feeling (Example: when we are feeling overwhelmed/stressed, engage in something calming. When we are feeling depressed/low energy, engage in something alerting). Group members engaged actively in discussion sharing their own personal sensory likes/dislikes.     A: [name] was active and independent in his participation of discussion and activity, sharing that he was "really intrigued" by this  topic and wanted to know strategies he could implement or use to better manage his anger outbursts. Pt identified rocking himself and using a rocking chair as strategies he finds soothing, though noted he never made the connection of its benefit. Appeared open and receptive to strategies and information reviewed/discussed.   P: Continue to attend PHP OT group sessions 5x week for 1 weeks to promote daily structure, social engagement, and opportunities to develop and utilize adaptive strategies to maximize functional performance in preparation for safe transition and integration back into school, work, and the community.   Plan - 01/18/21 1433     Occupational performance deficits (Please refer to evaluation for details): ADL's;IADL's;Rest and Sleep;Education;Work;Leisure;Social Participation    Body Structure / Function / Physical Skills ADL    Cognitive Skills Attention;Emotional;Energy/Drive;Learn;Memory;Perception;Problem Solve;Safety Awareness;Temperament/Personality;Thought;Understand    Psychosocial Skills Coping Strategies;Environmental  Adaptations;Habits;Interpersonal Interaction;Routines and Behaviors             Patient will benefit from skilled therapeutic intervention in order to improve the following deficits and impairments:   Body Structure / Function / Physical Skills: ADL Cognitive Skills: Attention, Emotional, Energy/Drive, Learn, Memory, Perception, Problem Solve, Safety Awareness, Temperament/Personality, Thought, Understand Psychosocial Skills: Coping Strategies, Environmental  Adaptations, Habits, Interpersonal Interaction, Routines and Behaviors   Visit Diagnosis: Difficulty coping  Frontal lobe and executive function deficit  Severe episode of recurrent major depressive disorder, without psychotic features [facility])    Problem List Patient Active Problem List   Diagnosis Date Noted   MDD (major depressive disorder), recurrent, in full remission (HCC) 08/11/2020    RLS (restless legs syndrome) 07/30/2020   Cannabis use disorder, severe, in early remission (HCC) 07/07/2020   Internal and external bleeding hemorrhoids 04/01/2020   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) 12/24/2019   Rectal bleeding 12/12/2019   Rectal pain 12/12/2019   Anal fissure 12/12/2019   Uncontrolled type 2 diabetes mellitus with hyperglycemia (HCC) 01/01/2019   Essential hypertension 07/05/2018   GAD (generalized anxiety disorder) 03/21/2018   Palpitations 02/27/2017   Morbid obesity with BMI of 60.0-69.9, adult (HCC) 04/14/2016   Tobacco use disorder 04/14/2016   Depression 07/03/2014   Prediabetes 02/10/2014   Psychological factors affecting morbid obesity (HCC) 02/04/2014   Severe cannabis use disorder (HCC) 10/16/2013   Atrial tachycardia (HCC) 05/29/2013   Chest pain 05/29/2013   Morbid obesity (HCC) 05/29/2013   Hyperlipidemia 05/29/2013   OSA (obstructive sleep apnea) 05/29/2013    01/18/2021  [name], MOT, OTR/L  01/18/2021, 2:38 PM  [facility] PARTIAL HOSPITALIZATION PROGRAM [address] [place], [place], [address] Phone: [phone]   Fax:  [phone]  Name: Smokey Melott MRN: [id number] Date of Birth: [birth year]

## 2021-01-18 NOTE — Progress Notes (Signed)
Spoke with patient via Webex video call, used 2 identifiers to correctly identify patient. States his doctor recommended PHP. He was having SI and depression but would never commit suicide because of his religious beliefs. He is morbidly obese but losing weight on his own instead of surgery. He is in pain due to having a broken right wrist. He will be having surgery on it  . Also has a ganglion cyst on his left wrist that needs removed. Would like to get on a medication to help him sleep as he only gets 3 hours a night. He falls asleep and wakes up within 20 mins. He use to have panic attacks daily and was ordering xanax online until he was started on Prozac and now panic attacks are under control. On scale 1-10 as 10 being worst he rates depression at 4 and anxiety at 4. Denies SI/HI or AV hallucinations. PHQ9=26. He reduced his dosage of Metform to 500mg  BID without his physicians approval. States his legs were going numb and tingling but have stopped after he decreased his dosage. No other side effects of medication. No issues or complaints.

## 2021-01-19 ENCOUNTER — Ambulatory Visit: Payer: Self-pay | Admitting: Urology

## 2021-01-19 ENCOUNTER — Ambulatory Visit: Payer: 59 | Admitting: Podiatry

## 2021-01-19 ENCOUNTER — Encounter: Payer: Self-pay | Admitting: Orthopaedic Surgery

## 2021-01-19 ENCOUNTER — Telehealth (HOSPITAL_COMMUNITY): Payer: Self-pay | Admitting: Professional

## 2021-01-19 ENCOUNTER — Ambulatory Visit (INDEPENDENT_AMBULATORY_CARE_PROVIDER_SITE_OTHER): Payer: 59 | Admitting: Orthopaedic Surgery

## 2021-01-19 ENCOUNTER — Other Ambulatory Visit: Payer: Self-pay

## 2021-01-19 ENCOUNTER — Ambulatory Visit: Payer: 59

## 2021-01-19 ENCOUNTER — Other Ambulatory Visit (HOSPITAL_COMMUNITY): Payer: 59

## 2021-01-19 DIAGNOSIS — M25532 Pain in left wrist: Secondary | ICD-10-CM | POA: Diagnosis not present

## 2021-01-19 DIAGNOSIS — M25531 Pain in right wrist: Secondary | ICD-10-CM | POA: Diagnosis not present

## 2021-01-19 NOTE — Psych (Signed)
Virtual Visit via Video Note  I connected with Maurice George on 01/18/20 at  9:00 AM EDT by a video enabled telemedicine application and verified that I am speaking with the correct person using two identifiers.  Location: Patient: Home Provider: Clinical Home Office   I discussed the limitations of evaluation and management by telemedicine and the availability of in person appointments. The patient expressed understanding and agreed to proceed.  Follow Up Instructions:    I discussed the assessment and treatment plan with the patient. The patient was provided an opportunity to ask questions and all were answered. The patient agreed with the plan and demonstrated an understanding of the instructions.   The patient was advised to call back or seek an in-person evaluation if the symptoms worsen or if the condition fails to improve as anticipated.  I provided 240 minutes of non-face-to-face time during this encounter.   Maurice George, [facility]    [facility] PHP THERAPIST PROGRESS NOTE  Maurice George [id number]  Session Time: 9-10  Participation Level: Active  Behavioral Response: CasualAlertAnxious and Depressed  Type of Therapy: Group Therapy  Treatment Goals addressed: Coping  Interventions: CBT, DBT, Solution Focused, Strength-based, Supportive, and Reframing  Summary: Clinician led check-in regarding current stressors and situation. Clinician utilized active listening and empathetic response and validated patient emotions. Clinician facilitated processing group on pertinent issues.  Therapist Response: Maurice George is a 35 y.o. male who presents with depression and anxiety symptoms. Patient arrived within time allowed and reports that he is feeling "stressed." Patient rates his mood at a 4 on a scale of 1-10 with 10 being great. Pt reports sleep and appetite are both decreased. Pt reports he had a stressful weekend due to his dog being sick. Pt shares his wife is pregnant  and pt is excited and stressed. Pt able to process. Pt engaged in discussion.        Session Time: 10:00 - 11:00   Participation Level: Active   Behavioral Response: CasualAlertDepressed   Type of Therapy: Group Therapy   Treatment Goals addressed: Coping   Interventions: CBT, DBT, Supportive and Reframing   Summary: Cln led discussion on stigma and the difference in journeys between people.     Therapist Response: Pt engaged in discussion. Pt reports he has experienced stigma related to her mental health.           Session Time: 11:00- 12:00   Participation Level: Active   Behavioral Response: CasualAlertDepressed   Type of Therapy: Group Therapy   Treatment Goals addressed: Coping   Interventions: CBT, DBT, Supportive and Reframing   Summary: Clinician led introduced the topic of "Healthy Relationships". Pts identified traits they want in healthy relationships and types of relationships they have. Group discussed foundational traits for healthy relationships (trust, respect, honesty, and open communication). Pt able to participate and process current relationships.  Therapist Response: Patient engaged in group. Pt reports working on current relationships to include these foundational traits.         Session Time: 12:00 -1:00   Participation Level: Active   Behavioral Response: CasualAlertDepressed   Type of Therapy: Group Therapy, OT   Treatment Goals addressed: Coping   Interventions: Psychosocial skills training, Supportive   Summary: 12:00-12:50: Occupational Therapy group 12:50 -1:00 Clinician led check-out. Clinician assessed for immediate needs, medication compliance and efficacy, and safety concerns  Therapist Response: 12:00 - 12:50: Patient engaged in group. See OT note 12:50 - 1:00: At check-out, patient rates his mood at  a 5 on a scale of 1-10 with 10 being great. Pt reports afternoon plans of napping. Pt demonstrates some progress as increased  insight into own journey and evaluations of self. Patient denies SI/HI at the end of group.   Suicidal/Homicidal: Nowithout intent/plan  Plan: Pt will continue in PHP while working to decrease depression symptoms, increase emotional regulation, and increase ability to manage symptoms in a healthy manner.   Diagnosis: Severe episode of recurrent major depressive disorder, without psychotic features (HCC) [F33.2]    1. Severe episode of recurrent major depressive disorder, without psychotic features [facility])       Maurice George, [facility] 01/17/2021

## 2021-01-19 NOTE — Progress Notes (Signed)
Office Visit Note   Patient: Maurice George           Date of Birth: 1985-08-04           MRN: [id number] Visit Date: 01/19/2021              Requested by: Eartha Inch, MD [address] [place],  [place] [address] PCP: Eartha Inch, MD   Assessment & Plan: Visit Diagnoses:  1. Pain in right wrist   2. Pain in left wrist     Plan: Based on findings the patient has a right SNAC wrist.  Referral to Dr. Frazier Butt for surgical treatment.  We will also talk to Dr. Frazier Butt about his chronic left wrist pain and ganglion cysts.  Follow-up with me as needed.  Follow-Up Instructions: Return for needs appt with Dr. Frazier Butt ASAP.   Orders:  Orders Placed This Encounter  Procedures   XR Wrist Complete Right   No orders of the defined types were placed in this encounter.     Procedures: No procedures performed   Clinical Data: No additional findings.   Subjective: Chief Complaint  Patient presents with   Right Wrist - New Patient (Initial Visit)   Left Wrist - New Patient (Initial Visit)    HPI  Maurice George is a 35 year old gentleman comes in for evaluation of bilateral wrist pain.  He recently had MRI of his left wrist and a CT scan of the right wrist.  He cannot recall any specific injuries to the right wrist but he states that he has had pain for years.  For the left wrist he is noticed a ganglion cyst on the volar side that will fluctuate in size.  He is right-hand dominant.  Review of Systems  Constitutional: Negative.   All other systems reviewed and are negative.   Objective: Vital Signs: There were no vitals taken for this visit.  Physical Exam Vitals and nursing note reviewed.  Constitutional:      Appearance: He is well-developed.  Pulmonary:     Effort: Pulmonary effort is normal.  Abdominal:     Palpations: Abdomen is soft.  Skin:    General: Skin is warm.  Neurological:     Mental Status: He is alert and oriented to person, place, and time.   Psychiatric:        Behavior: Behavior normal.        Thought Content: Thought content normal.        Judgment: Judgment normal.    Ortho Exam  Right wrist shows tenderness in the anatomic snuffbox and along the scaphoid tubercle.  Range of motion of the wrist is preserved with mild pain with passive range of motion.  Left wrist shows full range of motion.  No evidence of ganglion cyst.  No neurovascular compromise.   Specialty Comments:  No specialty comments available.  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) 12/24/2019   Rectal bleeding 12/12/2019   Rectal pain 12/12/2019   Anal fissure 12/12/2019   Uncontrolled type 2 diabetes mellitus with hyperglycemia (HCC)  01/01/2019   Essential hypertension 07/05/2018   GAD (generalized anxiety disorder) 03/21/2018   Palpitations 02/27/2017   Morbid obesity with BMI of 60.0-69.9, adult (HCC) 04/14/2016   Tobacco use disorder 04/14/2016   Depression 07/03/2014   Prediabetes 02/10/2014   Psychological factors affecting morbid obesity (HCC) 02/04/2014   Severe cannabis use disorder (HCC) 10/16/2013   Atrial tachycardia (HCC) 05/29/2013   Chest pain 05/29/2013   Morbid obesity (HCC) 05/29/2013   Hyperlipidemia 05/29/2013   OSA (obstructive sleep apnea) 05/29/2013   Past Medical History:  Diagnosis Date   Abnormal liver enzymes    Anal fissure    Anxiety    Asthma    Backache, unspecified    Depression    Diabetes mellitus (HCC)    H/O hemorrhoids    History of  chicken pox    Hyperlipidemia    Hypertension    Obesity    Other abnormal glucose    Sleep apnea     Family History  Problem Relation Age of Onset   Clotting disorder Father    Depression Maternal Grandfather    Hypertension Maternal Grandfather    Lung cancer Maternal Grandfather    Diabetes Maternal Grandmother    Hypertension Paternal Grandfather    Heart disease Paternal Grandfather    Heart attack Paternal Grandfather     Past Surgical History:  Procedure Laterality Date   TONSILLECTOMY  1999   Social History   Occupational History   Occupation: Patent attorney  Tobacco Use   Smoking status: Every Day    Packs/day: 0.50    Types: Cigarettes   Smokeless tobacco: Never  Vaping Use   Vaping Use: Never used  Substance and Sexual Activity   Alcohol use: Not Currently   Drug use: Yes    Types: Marijuana   Sexual activity: Yes    Birth control/protection: None

## 2021-01-20 ENCOUNTER — Other Ambulatory Visit: Payer: Self-pay

## 2021-01-20 ENCOUNTER — Ambulatory Visit: Payer: 59 | Admitting: Orthopedic Surgery

## 2021-01-20 ENCOUNTER — Other Ambulatory Visit (HOSPITAL_COMMUNITY): Payer: 59

## 2021-01-20 ENCOUNTER — Ambulatory Visit (HOSPITAL_COMMUNITY): Payer: 59

## 2021-01-20 DIAGNOSIS — M67432 Ganglion, left wrist: Secondary | ICD-10-CM

## 2021-01-20 DIAGNOSIS — M19131 Post-traumatic osteoarthritis, right wrist: Secondary | ICD-10-CM

## 2021-01-20 DIAGNOSIS — S62001S Unspecified fracture of navicular [scaphoid] bone of right wrist, sequela: Secondary | ICD-10-CM

## 2021-01-21 ENCOUNTER — Other Ambulatory Visit (HOSPITAL_COMMUNITY): Payer: 59 | Admitting: Occupational Therapy

## 2021-01-21 ENCOUNTER — Other Ambulatory Visit (HOSPITAL_COMMUNITY): Payer: 59 | Admitting: Professional

## 2021-01-21 ENCOUNTER — Encounter (HOSPITAL_COMMUNITY): Payer: Self-pay

## 2021-01-21 DIAGNOSIS — F332 Major depressive disorder, recurrent severe without psychotic features: Secondary | ICD-10-CM

## 2021-01-21 DIAGNOSIS — R4589 Other symptoms and signs involving emotional state: Secondary | ICD-10-CM

## 2021-01-21 DIAGNOSIS — R41844 Frontal lobe and executive function deficit: Secondary | ICD-10-CM

## 2021-01-21 NOTE — Therapy (Signed)
[facility] PARTIAL HOSPITALIZATION PROGRAM [address] [place], [place], [address] Phone: [phone]   Fax:  [phone] Virtual Visit via Video Note  I connected with Maurice George on 01/21/21 at  12:00 PM EDT by a video enabled telemedicine application and verified that I am speaking with the correct person using two identifiers.  Location: Patient: Patient Home Provider: Clinic Office   I discussed the limitations of evaluation and management by telemedicine and the availability of in person appointments. The patient expressed understanding and agreed to proceed.  I discussed the assessment and treatment plan with the patient. The patient was provided an opportunity to ask questions and all were answered. The patient agreed with the plan and demonstrated an understanding of the instructions.   The patient was advised to call back or seek an in-person evaluation if the symptoms worsen or if the condition fails to improve as anticipated.  I provided 50 minutes of non-face-to-face time during this encounter.   [name], OT/L   Occupational Therapy Treatment  Patient Details  Name: Maurice George MRN: [id number] Date of Birth: 07-Aug-1985 Referring Provider (OT): Hillery Jacks   Encounter Date: 01/21/2021   OT End of Session - 01/21/21 1426     Visit Number 5    Number of Visits 20    Date for OT Re-Evaluation 02/08/21    Authorization Type [employer]    OT Start Time 1200    OT Stop Time 1250    OT Time Calculation (min) 50 min    Activity Tolerance Patient tolerated treatment well    Behavior During Therapy WFL for tasks assessed/performed             Past Medical History:  Diagnosis Date   Abnormal liver enzymes    Anal fissure    Anxiety    Asthma    Backache, unspecified    Depression    Diabetes mellitus (HCC)    H/O hemorrhoids    History of chicken pox    Hyperlipidemia    Hypertension    Obesity    Other  abnormal glucose    Sleep apnea     Past Surgical History:  Procedure Laterality Date   TONSILLECTOMY  1999    There were no vitals filed for this visit.   Subjective Assessment - 01/21/21 1425     Currently in Pain? No/denies             OT Education - 01/21/21 1425     Education Details Educated on identifying worry and utilized circle on control tool to categorize what we do and do not have control over    Person(s) Educated Patient    Methods Explanation;Handout    Comprehension Verbalized understanding              OT Short Term Goals - 01/12/21 1528       OT SHORT TERM GOAL #1   Status On-going      OT SHORT TERM GOAL #2   Status On-going      OT SHORT TERM GOAL #3   Status On-going           Group Session:  S: "I have so many questions but this is exactly the type of information I need."  O: Group session encouraged increased participation and engagement through discussion focused on worry and our circle of control. Group reviewed a PowerPoint that discussed healthy vs unhealthy worry with specific examples provided. Discussion also  focused on utilizing the circle of control outline to identify what is within our control, what we have influence on, and what is not in our control. Group members shared specific examples and worries and identified what categories they fell in within the circle of control.   A: Maurice George was active and independent in his participation of discussion today, sharing that he wanted to be more of a listener, however had "several" questions regarding today's topic. Pt identified strong benefit of today's topic and expressed interest in reviewing powerpoint on his own to go through some of his own examples. Receptive to education and information received.   P: Continue to attend PHP OT group sessions 5x week for 2 weeks to promote daily structure, social engagement, and opportunities to develop and utilize adaptive strategies to  maximize functional performance in preparation for safe transition and integration back into school, work, and the community. Plan to address topic of stress management in next OT group session.   Plan - 01/21/21 1426     Occupational performance deficits (Please refer to evaluation for details): ADL's;IADL's;Rest and Sleep;Education;Work;Leisure;Social Participation    Body Structure / Function / Physical Skills ADL    Cognitive Skills Attention;Emotional;Energy/Drive;Learn;Memory;Perception;Problem Solve;Safety Awareness;Temperament/Personality;Thought;Understand    Psychosocial Skills Coping Strategies;Environmental  Adaptations;Habits;Interpersonal Interaction;Routines and Behaviors             Patient will benefit from skilled therapeutic intervention in order to improve the following deficits and impairments:   Body Structure / Function / Physical Skills: ADL Cognitive Skills: Attention, Emotional, Energy/Drive, Learn, Memory, Perception, Problem Solve, Safety Awareness, Temperament/Personality, Thought, Understand Psychosocial Skills: Coping Strategies, Environmental  Adaptations, Habits, Interpersonal Interaction, Routines and Behaviors   Visit Diagnosis: Difficulty coping  Frontal lobe and executive function deficit  Severe episode of recurrent major depressive disorder, without psychotic features [facility])    Problem List Patient Active Problem List   Diagnosis Date Noted   Major depressive disorder, recurrent episode, severe (HCC) 01/17/2021   MDD (major depressive disorder), recurrent, in full remission (HCC) 08/11/2020   RLS (restless legs syndrome) 07/30/2020   Cannabis use disorder, severe, in early remission (HCC) 07/07/2020   Internal and external bleeding hemorrhoids 04/01/2020   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) 12/24/2019   Rectal bleeding 12/12/2019   Rectal pain 12/12/2019   Anal fissure 12/12/2019   Uncontrolled type 2 diabetes  mellitus with hyperglycemia (HCC) 01/01/2019   Essential hypertension 07/05/2018   GAD (generalized anxiety disorder) 03/21/2018   Palpitations 02/27/2017   Morbid obesity with BMI of 60.0-69.9, adult (HCC) 04/14/2016   Tobacco use disorder 04/14/2016   Depression 07/03/2014   Prediabetes 02/10/2014   Psychological factors affecting morbid obesity (HCC) 02/04/2014   Severe cannabis use disorder (HCC) 10/16/2013   Atrial tachycardia (HCC) 05/29/2013   Chest pain 05/29/2013   Morbid obesity (HCC) 05/29/2013   Hyperlipidemia 05/29/2013   OSA (obstructive sleep apnea) 05/29/2013    01/21/2021  [name], MOT, OTR/L  01/21/2021, 2:26 PM  [facility] PARTIAL HOSPITALIZATION PROGRAM [address] [place], [place], [address] Phone: [phone]   Fax:  [phone]  Name: Maurice George MRN: [id number] Date of Birth: 03/19/1986

## 2021-01-24 ENCOUNTER — Other Ambulatory Visit: Payer: Self-pay

## 2021-01-24 ENCOUNTER — Ambulatory Visit: Payer: Self-pay | Admitting: Family Medicine

## 2021-01-24 ENCOUNTER — Other Ambulatory Visit (HOSPITAL_COMMUNITY): Payer: 59 | Admitting: Professional

## 2021-01-24 ENCOUNTER — Other Ambulatory Visit (HOSPITAL_COMMUNITY): Payer: 59 | Admitting: Occupational Therapy

## 2021-01-24 ENCOUNTER — Encounter (HOSPITAL_COMMUNITY): Payer: Self-pay

## 2021-01-24 DIAGNOSIS — R4589 Other symptoms and signs involving emotional state: Secondary | ICD-10-CM

## 2021-01-24 DIAGNOSIS — R41844 Frontal lobe and executive function deficit: Secondary | ICD-10-CM

## 2021-01-24 DIAGNOSIS — F332 Major depressive disorder, recurrent severe without psychotic features: Secondary | ICD-10-CM

## 2021-01-24 NOTE — Therapy (Signed)
[facility] PARTIAL HOSPITALIZATION PROGRAM [address] [place], [place], [address] Phone: [phone]   Fax:  [phone] Virtual Visit via Video Note  I connected with Maurice George on 01/24/21 at  11:00 AM EDT by a video enabled telemedicine application and verified that I am speaking with the correct person using two identifiers.  Location: Patient: Patient Home Provider: Clinic Office   I discussed the limitations of evaluation and management by telemedicine and the availability of in person appointments. The patient expressed understanding and agreed to proceed.   I discussed the assessment and treatment plan with the patient. The patient was provided an opportunity to ask questions and all were answered. The patient agreed with the plan and demonstrated an understanding of the instructions.   The patient was advised to call back or seek an in-person evaluation if the symptoms worsen or if the condition fails to improve as anticipated.  I provided 60 minutes of non-face-to-face time during this encounter.   [name], OT/L   Occupational Therapy Treatment  Patient Details  Name: Maurice George MRN: [id number] Date of Birth: [birth year] Referring Provider (OT): Hillery Jacks   Encounter Date: 01/24/2021   OT End of Session - 01/24/21 1316     Visit Number 6    Number of Visits 20    Date for OT Re-Evaluation 02/08/21    Authorization Type [employer]    OT Start Time 1105    OT Stop Time 1205    OT Time Calculation (min) 60 min    Activity Tolerance Patient tolerated treatment well    Behavior During Therapy WFL for tasks assessed/performed             Past Medical History:  Diagnosis Date   Abnormal liver enzymes    Anal fissure    Anxiety    Asthma    Backache, unspecified    Depression    Diabetes mellitus (HCC)    H/O hemorrhoids    History of chicken pox    Hyperlipidemia    Hypertension    Obesity     Other abnormal glucose    Sleep apnea     Past Surgical History:  Procedure Laterality Date   TONSILLECTOMY  1999    There were no vitals filed for this visit.   Subjective Assessment - 01/24/21 1316     Currently in Pain? No/denies                                  OT Education - 01/24/21 1316     Education Details Educated on tips and strategies to improve overall self-care    Person(s) Educated Patient    Methods Explanation;Handout    Comprehension Verbalized understanding              OT Short Term Goals - 01/12/21 1528       OT SHORT TERM GOAL #1   Status On-going      OT SHORT TERM GOAL #2   Status On-going      OT SHORT TERM GOAL #3   Status On-going           Group Session:  S: "I'm struggling to find one thing I'm doing well, but I can say that I've really kept up with my hygiene and staying clean. I can be the fat guy, but I can't be the fat, stinky guy."  O:  Today's group session focused on topic of self-care and group reviewed a self-care assessment that identified a specific category within self-care that they needed the most improvement in, including physical, emotional/psychological, social, spiritual, and professional self-care. Discussion focused on members sharing both areas of strength and areas of improvement. Members were encouraged to brainstorm together to identify specific strategies to overcome and work through the areas of improvement.    A: Maurice George was active and independent in his participation of discussion and activity, sharing that overall he struggles with self-care, however identified his hygiene as one area of physical self-care that he is doing well. Pt identified exercise and sleep areas of self-care he would like improvement and support around improving. Pt proved receptive to education and strategies offered from leader and group members.   P: Continue to attend PHP OT group sessions 5x week for the  remainder of the week to promote daily structure, social engagement, and opportunities to develop and utilize adaptive strategies to maximize functional performance in preparation for safe transition and integration back into school, work, and the community.    Plan - 01/24/21 1316     Occupational performance deficits (Please refer to evaluation for details): ADL's;IADL's;Rest and Sleep;Education;Work;Leisure;Social Participation    Body Structure / Function / Physical Skills ADL    Cognitive Skills Attention;Emotional;Energy/Drive;Learn;Memory;Perception;Problem Solve;Safety Awareness;Temperament/Personality;Thought;Understand    Psychosocial Skills Coping Strategies;Environmental  Adaptations;Habits;Interpersonal Interaction;Routines and Behaviors             Patient will benefit from skilled therapeutic intervention in order to improve the following deficits and impairments:   Body Structure / Function / Physical Skills: ADL Cognitive Skills: Attention, Emotional, Energy/Drive, Learn, Memory, Perception, Problem Solve, Safety Awareness, Temperament/Personality, Thought, Understand Psychosocial Skills: Coping Strategies, Environmental  Adaptations, Habits, Interpersonal Interaction, Routines and Behaviors   Visit Diagnosis: Difficulty coping  Frontal lobe and executive function deficit  Severe episode of recurrent major depressive disorder, without psychotic features [facility])    Problem List Patient Active Problem List   Diagnosis Date Noted   Major depressive disorder, recurrent episode, severe (HCC) 01/17/2021   MDD (major depressive disorder), recurrent, in full remission (HCC) 08/11/2020   RLS (restless legs syndrome) 07/30/2020   Cannabis use disorder, severe, in early remission (HCC) 07/07/2020   Internal and external bleeding hemorrhoids 04/01/2020   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) 12/24/2019   Rectal bleeding 12/12/2019   Rectal pain  12/12/2019   Anal fissure 12/12/2019   Uncontrolled type 2 diabetes mellitus with hyperglycemia (HCC) 01/01/2019   Essential hypertension 07/05/2018   GAD (generalized anxiety disorder) 03/21/2018   Palpitations 02/27/2017   Morbid obesity with BMI of 60.0-69.9, adult (HCC) 04/14/2016   Tobacco use disorder 04/14/2016   Depression 07/03/2014   Prediabetes 02/10/2014   Psychological factors affecting morbid obesity (HCC) 02/04/2014   Severe cannabis use disorder (HCC) 10/16/2013   Atrial tachycardia (HCC) 05/29/2013   Chest pain 05/29/2013   Morbid obesity (HCC) 05/29/2013   Hyperlipidemia 05/29/2013   OSA (obstructive sleep apnea) 05/29/2013    01/24/2021  [name], MOT, OTR/L  01/24/2021, 1:17 PM  [facility] PARTIAL HOSPITALIZATION PROGRAM [address] [place], [place], [address] Phone: [phone]   Fax:  [phone]  Name: Maurice George MRN: [id number] Date of Birth: 09/28/1985

## 2021-01-24 NOTE — Psych (Signed)
Virtual Visit via Video Note  I connected with Maurice George on 01/19/20 at  9:00 AM EDT by a video enabled telemedicine application and verified that I am speaking with the correct person using two identifiers.  Location: Patient: Home Provider: Clinical Home Office   I discussed the limitations of evaluation and management by telemedicine and the availability of in person appointments. The patient expressed understanding and agreed to proceed.  Follow Up Instructions:    I discussed the assessment and treatment plan with the patient. The patient was provided an opportunity to ask questions and all were answered. The patient agreed with the plan and demonstrated an understanding of the instructions.   The patient was advised to call back or seek an in-person evaluation if the symptoms worsen or if the condition fails to improve as anticipated.  I provided 240 minutes of non-face-to-face time during this encounter.   Maurice George, [facility]    [facility] PHP THERAPIST PROGRESS NOTE  [name] [id number]  Session Time: 9-10  Participation Level: Active  Behavioral Response: CasualAlertAnxious and Depressed  Type of Therapy: Group Therapy  Treatment Goals addressed: Coping  Interventions: CBT, DBT, Supportive, and Reframing   Summary: Counselor introduced [employer], [facility] Chaplain to present information and discussion on Grief and Loss. Group members engaged in discussion, sharing how grief impacts them, what comforts them, what emotions are felt, labeling losses, etc.    Therapist Response:  Pt engaged in discussion when and is able to identify with grief at different times of life.      Session Time: 10:00 - 11:00   Participation Level: Active   Behavioral Response: CasualAlertDepressed   Type of Therapy: Group Therapy   Treatment Goals addressed: Coping   Interventions: CBT, DBT, Supportive and Reframing   Summary: Clinician led check-in regarding  current stressors and situation. Clinician utilized active listening and empathetic response and validated patient emotions. Clinician facilitated processing group on pertinent issues.     Therapist Response:  Patient arrived within time allowed and reports that he is feeling "OK." Patient rates his mood at a 7 on a scale of 1-10 with 10 being great. Pt reports sleep is "better but not great" and appetite is "good." Pt reports he had a good afternoon and got "a lot of good news." Pt reports feeling like he is getting things "checked off the list" while he is out of work. Pt able to process. Pt engaged in discussion.       Session Time: 11:00- 12:00  Participation Level: Active   Behavioral Response: CasualAlertDepressed   Type of Therapy: Group therapy   Treatment Goals addressed: Coping   Interventions: CBT; Solution focused; Supportive; Reframing  Summary: Counselor introduced [name], [place] Chaplain to present information and discussion on [place]. Group members engaged in activity.     Therapist Response: Pt engaged in discussion. Pt reports finding hope in growth.          Session Time: 12:00 -1:00  Participation Level: Active   Behavioral Response: CasualAlertDepressed   Type of Therapy: Group Therapy, OT   Treatment Goals addressed: Coping   Interventions: Psychosocial skills training, Supportive   Summary: 12:00 - 12:50: Occupational Therapy group 12:50 -1:00 Clinician led check-out. Clinician assessed for immediate needs, medication compliance and efficacy, and safety concerns   Therapist Response: 12:00 - 12:50: Patient engaged in group. See OT note.   12:50 - 1:00: At check-out, patient rates his mood at a 7 on a scale  of 1-10 with 10 being great. Pt reports afternoon plans of spending time with wife. Pt demonstrates some progress as evidenced by increased insight. Patient denies SI/HI at the end of group.  Suicidal/Homicidal: Nowithout  intent/plan  Plan: Pt will continue in PHP while working to decrease depression symptoms, increase emotional regulation, and increase ability to manage symptoms in a healthy manner.   Diagnosis: Severe episode of recurrent major depressive disorder, without psychotic features (HCC) [F33.2]    1. Severe episode of recurrent major depressive disorder, without psychotic features [facility])       Maurice George, [facility] 01/18/2021

## 2021-01-25 ENCOUNTER — Encounter (HOSPITAL_COMMUNITY): Payer: Self-pay

## 2021-01-25 ENCOUNTER — Other Ambulatory Visit: Payer: Self-pay

## 2021-01-25 ENCOUNTER — Other Ambulatory Visit (HOSPITAL_COMMUNITY): Payer: 59 | Admitting: Occupational Therapy

## 2021-01-25 ENCOUNTER — Other Ambulatory Visit (HOSPITAL_COMMUNITY): Payer: 59 | Admitting: Professional

## 2021-01-25 DIAGNOSIS — F332 Major depressive disorder, recurrent severe without psychotic features: Secondary | ICD-10-CM | POA: Diagnosis not present

## 2021-01-25 DIAGNOSIS — R41844 Frontal lobe and executive function deficit: Secondary | ICD-10-CM

## 2021-01-25 DIAGNOSIS — R4589 Other symptoms and signs involving emotional state: Secondary | ICD-10-CM

## 2021-01-25 NOTE — Therapy (Signed)
[facility] PARTIAL HOSPITALIZATION PROGRAM [address] [place], [place], [address] Phone: [phone]   Fax:  [phone] Virtual Visit via Video Note  I connected with Maurice George on 01/25/21 at  12:15 PM EDT by a video enabled telemedicine application and verified that I am speaking with the correct person using two identifiers.  Location: Patient: Patient Home Provider: Clinic Office    I discussed the limitations of evaluation and management by telemedicine and the availability of in person appointments. The patient expressed understanding and agreed to proceed.   I discussed the assessment and treatment plan with the patient. The patient was provided an opportunity to ask questions and all were answered. The patient agreed with the plan and demonstrated an understanding of the instructions.   The patient was advised to call back or seek an in-person evaluation if the symptoms worsen or if the condition fails to improve as anticipated.  I provided 35 minutes of non-face-to-face time during this encounter.   [name], OT/L   Occupational Therapy Treatment  Patient Details  Name: Maurice George MRN: [id number] Date of Birth: 04/21/1986 Referring Provider (OT): Hillery Jacks   Encounter Date: 01/25/2021   OT End of Session - 01/25/21 1305     Visit Number 7    Number of Visits 20    Date for OT Re-Evaluation 02/08/21    Authorization Type [employer]    OT Start Time 1215    OT Stop Time 1250    OT Time Calculation (min) 35 min    Activity Tolerance Patient limited by lethargy    Behavior During Therapy WFL for tasks assessed/performed             Past Medical History:  Diagnosis Date   Abnormal liver enzymes    Anal fissure    Anxiety    Asthma    Backache, unspecified    Depression    Diabetes mellitus (HCC)    H/O hemorrhoids    History of chicken pox    Hyperlipidemia    Hypertension    Obesity    Other  abnormal glucose    Sleep apnea     Past Surgical History:  Procedure Laterality Date   TONSILLECTOMY  1999    There were no vitals filed for this visit.   Subjective Assessment - 01/25/21 1305     Currently in Pain? No/denies                                  OT Education - 01/25/21 1305     Education Details Educated on physical symptomology of stress and its effects on the body, along with positive stress management tips/strategies    Person(s) Educated Patient    Methods Explanation;Handout    Comprehension Verbalized understanding              OT Short Term Goals - 01/12/21 1528       OT SHORT TERM GOAL #1   Status On-going      OT SHORT TERM GOAL #2   Status On-going      OT SHORT TERM GOAL #3   Status On-going           Group Session:  S: "Honestly, I think part of the reason why I am here is because I don't have healthy ways of dealing with my stress."  O: Today's discussion focused on the  topic of stress management. Group members worked collaboratively to create a Microbiologist identifying physical signs, behavioral signs, emotional/psychological, and cognitive signs of stress. Discussion then focused and encouraged group members to identify positive stress management strategies they could utilize in those moments to manage identified signs.   A: [name] was more withdrawn and disengaged in today's session than previously noted, sharing that he was feeling physically unwell and got limited sleep last night. Pt participated with encouragement and was able to identify that in the past he has "always" managed his stress negatively and was open and excited to learn more positive strategies from both group and peers. Pt expressed recent practice with use of grounding techniques.   P: Continue to attend PHP OT group sessions 5x week for the remainder of the weeks to promote daily structure, social engagement, and opportunities  to develop and utilize adaptive strategies to maximize functional performance in preparation for safe transition and integration back into school, work, and the community.   Plan - 01/25/21 1305     Occupational performance deficits (Please refer to evaluation for details): ADL's;IADL's;Rest and Sleep;Education;Work;Leisure;Social Participation    Body Structure / Function / Physical Skills ADL    Cognitive Skills Attention;Emotional;Energy/Drive;Learn;Memory;Perception;Problem Solve;Safety Awareness;Temperament/Personality;Thought;Understand    Psychosocial Skills Coping Strategies;Environmental  Adaptations;Habits;Interpersonal Interaction;Routines and Behaviors             Patient will benefit from skilled therapeutic intervention in order to improve the following deficits and impairments:   Body Structure / Function / Physical Skills: ADL Cognitive Skills: Attention, Emotional, Energy/Drive, Learn, Memory, Perception, Problem Solve, Safety Awareness, Temperament/Personality, Thought, Understand Psychosocial Skills: Coping Strategies, Environmental  Adaptations, Habits, Interpersonal Interaction, Routines and Behaviors   Visit Diagnosis: Difficulty coping  Frontal lobe and executive function deficit  Severe episode of recurrent major depressive disorder, without psychotic features [facility])    Problem List Patient Active Problem List   Diagnosis Date Noted   Major depressive disorder, recurrent episode, severe (HCC) 01/17/2021   MDD (major depressive disorder), recurrent, in full remission (HCC) 08/11/2020   RLS (restless legs syndrome) 07/30/2020   Cannabis use disorder, severe, in early remission (HCC) 07/07/2020   Internal and external bleeding hemorrhoids 04/01/2020   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) 12/24/2019   Rectal bleeding 12/12/2019   Rectal pain 12/12/2019   Anal fissure 12/12/2019   Uncontrolled type 2 diabetes mellitus with  hyperglycemia (HCC) 01/01/2019   Essential hypertension 07/05/2018   GAD (generalized anxiety disorder) 03/21/2018   Palpitations 02/27/2017   Morbid obesity with BMI of 60.0-69.9, adult (HCC) 04/14/2016   Tobacco use disorder 04/14/2016   Depression 07/03/2014   Prediabetes 02/10/2014   Psychological factors affecting morbid obesity (HCC) 02/04/2014   Severe cannabis use disorder (HCC) 10/16/2013   Atrial tachycardia (HCC) 05/29/2013   Chest pain 05/29/2013   Morbid obesity (HCC) 05/29/2013   Hyperlipidemia 05/29/2013   OSA (obstructive sleep apnea) 05/29/2013    01/25/2021  [name], MOT, OTR/L  01/25/2021, 1:06 PM  [facility] PARTIAL HOSPITALIZATION PROGRAM [address] [place], [place], [address] Phone: [phone]   Fax:  [phone]  Name: Ozro Russett MRN: [id number] Date of Birth: 1986/01/03

## 2021-01-26 ENCOUNTER — Other Ambulatory Visit: Payer: Self-pay

## 2021-01-26 ENCOUNTER — Encounter (HOSPITAL_COMMUNITY): Payer: Self-pay | Admitting: Family

## 2021-01-26 ENCOUNTER — Other Ambulatory Visit (HOSPITAL_COMMUNITY): Payer: 59 | Admitting: Professional

## 2021-01-26 ENCOUNTER — Other Ambulatory Visit (HOSPITAL_COMMUNITY): Payer: 59

## 2021-01-26 DIAGNOSIS — R4589 Other symptoms and signs involving emotional state: Secondary | ICD-10-CM

## 2021-01-26 DIAGNOSIS — F332 Major depressive disorder, recurrent severe without psychotic features: Secondary | ICD-10-CM

## 2021-01-26 NOTE — Progress Notes (Signed)
  Virtual Visit via Video Note  I connected with Maurice George on 01/26/21 at  9:00 AM EDT by a video enabled telemedicine application and verified that I am speaking with the correct person using two identifiers.  Location: Patient: Home Provider: Office   I discussed the limitations of evaluation and management by telemedicine and the availability of in person appointments. The patient expressed understanding and agreed to proceed.   I discussed the assessment and treatment plan with the patient. The patient was provided an opportunity to ask questions and all were answered. The patient agreed with the plan and demonstrated an understanding of the instructions.   The patient was advised to call back or seek an in-person evaluation if the symptoms worsen or if the condition fails to improve as anticipated.  I provided 15 minutes of non-face-to-face time during this encounter.   [name], NP   [facility] Intensive Outpatient Program Discharge Summary  Maurice George [id number]  Admission date: 01/14/2021 Discharge date: 01/28/2021  Reason for admission: per admission assessment note: Maurice George is a 35 y.o. Caucasian male presents with depression and anxiety.  Reports he was followed by mental health services through [facility] however decided to discontinue his service and is seeking new healthcare providers.  He reports a history of panic attacks, posttraumatic stress disorder and depression.  I was diagnosed with antisocial personality disorder.  states he needs help with "emotional regulation".  States he is currently prescribed Prozac 40 mg and Neurontin 1600 mg daily.  He reports taking and tolerating medications well.  States frustration related to diagnosis of benzo dependency. 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     Family of Origin Issues: Jeziah reports history patient's wife remains supportive.  And would like to follow-up with medication management assessment.  States he continues to report symptoms of mood irritability, brain fog frustration, fear and anxiety. "  I been dealing with the symptoms for the past 20 years."  Progress in Program Toward Treatment Goals: Ongoing, patient attended and participated with daily group session with active and engaged participation.  He continues to deny suicidal or homicidal ideations.  Denies auditory or visual hallucinations.  Reports frustration with medication/therapy services.  Patient declined medication refills at this time.  Patient to transition to intensive outpatient program on 01/31/2021  Progress (rationale): Ervey is stepping down to intensive outpatient programming on 01/31/2021  Take all medications as prescribed. Keep all follow-up appointments as scheduled.  Do not consume alcohol or use illegal drugs while on prescription medications. Report any adverse effects from your medications to your primary care provider promptly.  In the event of recurrent symptoms or worsening symptoms, call 911, a crisis hotline, or go to the nearest emergency department for evaluation.    [name], NP 01/26/2021

## 2021-01-26 NOTE — Progress Notes (Signed)
Spoke with patient via Webex video call, used 2 identifiers to correctly identify patient. States that groups are going good and he is sleeping better. Had a good weekend and was able to have a good talk with his wife. Feels that things are improving. On scale 1-10 as 10 being worst he rates depression at 1 and anxiety at 2. Denies SI/HI or AV hallucinations. PHQ9=14. No issues or complaints. No side effects from medication.

## 2021-01-27 ENCOUNTER — Encounter (HOSPITAL_COMMUNITY): Payer: Self-pay

## 2021-01-27 ENCOUNTER — Other Ambulatory Visit (HOSPITAL_COMMUNITY): Payer: 59 | Admitting: Occupational Therapy

## 2021-01-27 ENCOUNTER — Other Ambulatory Visit: Payer: Self-pay

## 2021-01-27 ENCOUNTER — Other Ambulatory Visit (HOSPITAL_COMMUNITY): Payer: 59 | Admitting: Professional

## 2021-01-27 DIAGNOSIS — F332 Major depressive disorder, recurrent severe without psychotic features: Secondary | ICD-10-CM | POA: Diagnosis not present

## 2021-01-27 DIAGNOSIS — R41844 Frontal lobe and executive function deficit: Secondary | ICD-10-CM

## 2021-01-27 DIAGNOSIS — R4589 Other symptoms and signs involving emotional state: Secondary | ICD-10-CM

## 2021-01-27 NOTE — Therapy (Signed)
[facility] PARTIAL HOSPITALIZATION PROGRAM [address] [place], [place], [address] Phone: [phone]   Fax:  [phone] Virtual Visit via Video Note  I connected with Maurice George on 01/27/21 at  12:00 PM EDT by a video enabled telemedicine application and verified that I am speaking with the correct person using two identifiers.  Location: Patient: Patient Home Provider: Clinic Office   I discussed the limitations of evaluation and management by telemedicine and the availability of in person appointments. The patient expressed understanding and agreed to proceed.  I discussed the assessment and treatment plan with the patient. The patient was provided an opportunity to ask questions and all were answered. The patient agreed with the plan and demonstrated an understanding of the instructions.   The patient was advised to call back or seek an in-person evaluation if the symptoms worsen or if the condition fails to improve as anticipated.  I provided 50 minutes of non-face-to-face time during this encounter.   Maurice George, OT/L   Occupational Therapy Treatment  Patient Details  Name: Maurice George MRN: [id number] Date of Birth: 01-20-1986 Referring Provider (OT): Hillery Jacks   Encounter Date: 01/27/2021   OT End of Session - 01/27/21 1527     Visit Number 8    Number of Visits 20    Date for OT Re-Evaluation 02/08/21    Authorization Type [employer]    OT Start Time 1200    OT Stop Time 1250    OT Time Calculation (min) 50 min    Activity Tolerance Patient tolerated treatment well    Behavior During Therapy WFL for tasks assessed/performed             Past Medical History:  Diagnosis Date   Abnormal liver enzymes    Anal fissure    Anxiety    Asthma    Backache, unspecified    Depression    Diabetes mellitus (HCC)    H/O hemorrhoids    History of chicken pox    Hyperlipidemia    Hypertension    Obesity    Other  abnormal glucose    Sleep apnea     Past Surgical History:  Procedure Laterality Date   TONSILLECTOMY  1999    There were no vitals filed for this visit.   Subjective Assessment - 01/27/21 1526     Currently in Pain? No/denies                                  OT Education - 01/27/21 1527     Education Details Educated on different factors that contribute to our ability to manage our time, along with specific time management tips/strategies, including procrastination    Person(s) Educated Patient    Methods Explanation;Handout    Comprehension Verbalized understanding              OT Short Term Goals - 01/12/21 1528       OT SHORT TERM GOAL #1   Status On-going      OT SHORT TERM GOAL #2   Status On-going      OT SHORT TERM GOAL #3   Status On-going           Group Session:  S: "I go 150 mph all the time and never stop for myself or other people's needs."  O: Group began with a warm-up ice breaker activity where patients were  encouraged to reflect on the scenario "If you had 86,400 dollars dropped into your bank account at midnight and 24 hours to spend it, what you use the money for? The only rule was that any money not used disappeared after 24 hours." Warm-up activity was used as an Web designer and a way to connect that similar to the scenario of money, we do not get time back and there are 86,400 seconds in a day. Warm-up transitioned into today's group focused on topic of Time Management. Group members identified ways in which they currently struggle with managing their time and discussion focused on alternative strategies to recognize how we can be more productive and intentional with our time and managing it appropriately. Group members also discussed specific strategies to overcome procrastination.   A: Maurice George was active and independent in his participation of discussion and activity, sharing that he struggles to manage his time, often  going "150 mph" all the time and never leaving time for self-care or his wife. Appeared open and receptive to strategies reviewed during session.   P: Continue to attend PHP OT group sessions 5x week for the remainder weeks to promote daily structure, social engagement, and opportunities to develop and utilize adaptive strategies to maximize functional performance in preparation for safe transition and integration back into school, work, and the community. Plan to address topic of time management in next OT group session.  Plan - 01/27/21 1534     Occupational performance deficits (Please refer to evaluation for details): ADL's;IADL's;Rest and Sleep;Education;Work;Leisure;Social Participation    Body Structure / Function / Physical Skills ADL    Cognitive Skills Attention;Emotional;Energy/Drive;Learn;Memory;Perception;Problem Solve;Safety Awareness;Temperament/Personality;Thought;Understand    Psychosocial Skills Coping Strategies;Environmental  Adaptations;Habits;Interpersonal Interaction;Routines and Behaviors             Patient will benefit from skilled therapeutic intervention in order to improve the following deficits and impairments:   Body Structure / Function / Physical Skills: ADL Cognitive Skills: Attention, Emotional, Energy/Drive, Learn, Memory, Perception, Problem Solve, Safety Awareness, Temperament/Personality, Thought, Understand Psychosocial Skills: Coping Strategies, Environmental  Adaptations, Habits, Interpersonal Interaction, Routines and Behaviors   Visit Diagnosis: Difficulty coping  Frontal lobe and executive function deficit  Severe episode of recurrent major depressive disorder, without psychotic features [facility])    Problem List Patient Active Problem List   Diagnosis Date Noted   Major depressive disorder, recurrent episode, severe (HCC) 01/17/2021   MDD (major depressive disorder), recurrent, in full remission (HCC) 08/11/2020   RLS (restless legs  syndrome) 07/30/2020   Cannabis use disorder, severe, in early remission (HCC) 07/07/2020   Internal and external bleeding hemorrhoids 04/01/2020   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) 12/24/2019   Rectal bleeding 12/12/2019   Rectal pain 12/12/2019   Anal fissure 12/12/2019   Uncontrolled type 2 diabetes mellitus with hyperglycemia (HCC) 01/01/2019   Essential hypertension 07/05/2018   GAD (generalized anxiety disorder) 03/21/2018   Palpitations 02/27/2017   Morbid obesity with BMI of 60.0-69.9, adult (HCC) 04/14/2016   Tobacco use disorder 04/14/2016   Depression 07/03/2014   Prediabetes 02/10/2014   Psychological factors affecting morbid obesity (HCC) 02/04/2014   Severe cannabis use disorder (HCC) 10/16/2013   Atrial tachycardia (HCC) 05/29/2013   Chest pain 05/29/2013   Morbid obesity (HCC) 05/29/2013   Hyperlipidemia 05/29/2013   OSA (obstructive sleep apnea) 05/29/2013    01/27/2021  Maurice George, MOT, OTR/L  01/27/2021, 3:34 PM  [facility] PARTIAL HOSPITALIZATION PROGRAM [address] [place],  [place], [address] Phone: [phone]   Fax:  [phone]  Name: Onyx Schirmer MRN: [id number] Date of Birth: [birth year]

## 2021-01-27 NOTE — Psych (Signed)
Virtual Visit via Video Note  I connected with Maurice George on 01/22/20 at  9:00 AM EDT by a video enabled telemedicine application and verified that I am speaking with the correct person using two identifiers.  Location: Patient: Home Provider: Clinical Home Office   I discussed the limitations of evaluation and management by telemedicine and the availability of in person appointments. The patient expressed understanding and agreed to proceed.  Follow Up Instructions:    I discussed the assessment and treatment plan with the patient. The patient was provided an opportunity to ask questions and all were answered. The patient agreed with the plan and demonstrated an understanding of the instructions.   The patient was advised to call back or seek an in-person evaluation if the symptoms worsen or if the condition fails to improve as anticipated.  I provided 240 minutes of non-face-to-face time during this encounter.   [name], [facility]    [facility] BH PHP THERAPIST PROGRESS NOTE  Maurice George [id number]  Session Time: 9-10  Participation Level: Active  Behavioral Response: CasualAlertAnxious and Depressed  Type of Therapy: Group Therapy  Treatment Goals addressed: Coping  Interventions: CBT, DBT, Solution Focused, Strength-based, Supportive, and Reframing  Summary: Clinician led check-in regarding current stressors and situation. Clinician utilized active listening and empathetic response and validated patient emotions. Clinician facilitated processing group on pertinent issues.  Therapist Response: Maurice George is a 35 y.o. male who presents with depression and anxiety symptoms. Patient arrived within time allowed and reports that he is feeling "tired." Patient rates his mood at a 6 on a scale of 1-10 with 10 being great. Pt reports sleep is "crappy" and appetite is "good." Pt reports he had an OK afternoon. Pt reports getting surgery scheduled for his wrist that has  been hurt for a while and feeling good about that. Pt reports feeling increased anger due to not being able to find a handy man to help with household jobs. Pt reports trying some coping skills but not feeling much relief. Pt able to process. Pt engaged in discussion.       Session Time: 10:00 - 11:00   Participation Level: Active   Behavioral Response: CasualAlertDepressed   Type of Therapy: Group Therapy   Treatment Goals addressed: Coping   Interventions: CBT, DBT, Supportive and Reframing   Summary: Cln continued topic of distress tolerance. Group discussed "ACCEPTS" skill and how/when patients can employ this method to help.  Patients identified when this technique may be helpful in their personal lives. Patients participated in making Top 5 lists to help in heightened states of mind.     Therapist Response: Pt engaged in discussion and is able to identify when ACCEPTS could be helpful.  Patient created Top 5 lists including movies to watch, tv shows to watch, and places to go fishing.       Session Time: 11:00- 12:00  Participation Level: Active   Behavioral Response: CasualAlertDepressed   Type of Therapy: Group therapy   Treatment Goals addressed: Coping   Interventions: CBT; Solution focused; Supportive; Reframing  Summary: Cln continued topic of distress tolerance. Group discussed "Self-Soothe" skill and how/when patients can employ this method to help.  Patients identified when this technique may be helpful in their personal lives.  Therapist Response: Pt engaged in discussion. Pt reports vision and touch self-soothe techniques would help him the most.    Session Time: 12:00 -1:00  Participation Level: Active   Behavioral Response: CasualAlertDepressed   Type of Therapy:  Group Therapy, OT   Treatment Goals addressed: Coping   Interventions: Psychosocial skills training, Supportive   Summary: 12:00 - 12:50: Occupational Therapy group 12:50 -1:00 Clinician  led check-out. Clinician assessed for immediate needs, medication compliance and efficacy, and safety concerns   Therapist Response: 12:00 - 12:50: Patient engaged in group. See OT note.   12:50 - 1:00: At check-out, patient rates his mood at an 8 on a scale of 1-10 with 10 being great. Pt reports afternoon plans of cleaning out his garage. Pt demonstrates some progress as evidenced by willingness to try skills to manage anger and other emotions. Patient denies SI/HI at the end of group.   Suicidal/Homicidal: Nowithout intent/plan  Plan: Pt will continue in PHP while working to decrease depression symptoms, increase emotional regulation, and increase ability to manage symptoms in a healthy manner.   Diagnosis: Severe episode of recurrent major depressive disorder, without psychotic features (HCC) [F33.2]    1. Severe episode of recurrent major depressive disorder, without psychotic features [facility])       [name], [facility] 01/21/2021

## 2021-01-27 NOTE — Psych (Signed)
Virtual Visit via Video Note  I connected with Maurice George on 01/24/21 at  9:00 AM EDT by a video enabled telemedicine application and verified that I am speaking with the correct person using two identifiers.  Location: Patient: Home Provider: Clinical Home Office   I discussed the limitations of evaluation and management by telemedicine and the availability of in person appointments. The patient expressed understanding and agreed to proceed.  Follow Up Instructions:    I discussed the assessment and treatment plan with the patient. The patient was provided an opportunity to ask questions and all were answered. The patient agreed with the plan and demonstrated an understanding of the instructions.   The patient was advised to call back or seek an in-person evaluation if the symptoms worsen or if the condition fails to improve as anticipated.  I provided 240 minutes of non-face-to-face time during this encounter.   Maurice George, [facility]    [facility] BH PHP THERAPIST PROGRESS NOTE  Maurice George [id number]  Session Time: 9-10  Participation Level: Active  Behavioral Response: CasualAlertAnxious and Depressed  Type of Therapy: Group Therapy  Treatment Goals addressed: Coping  Interventions: CBT, DBT, Solution Focused, Strength-based, Supportive, and Reframing  Summary: Clinician led check-in regarding current stressors and situation. Clinician utilized active listening and empathetic response and validated patient emotions. Clinician facilitated processing group on pertinent issues.  Therapist Response: Maurice George is a 35 y.o. male who presents with depression and anxiety symptoms. Patient arrived within time allowed and reports that he is feeling "good." Patient rates his mood at a 7 on a scale of 1-10 with 10 being great. Pt reports his weekend went well and he had a good discussions with his wife. Pt reports gaining "clarity" and wants to rely on his wife more to help  him. Pt continues to struggle with expectations. Pt able to process. Pt engaged in discussion.           Session Time: 10:00 - 11:00   Participation Level: Active   Behavioral Response: CasualAlertDepressed   Type of Therapy: Group Therapy   Treatment Goals addressed: Coping   Interventions: CBT, DBT, Supportive and Reframing   Summary: Cln led discussion on the connection between fear and anxiety. Cln contextualized anxiety and fear as both being future oriented and fed by the unknown. Group members shared ways in which fear interacts with their anxiety and the problems it creates. Cln encouraged CBT thought challenging and reframing as ways to address problematic fears and anxieties.     Therapist Response: Pt engaged in discussion and is able to make connections between fear and anxiety.           Session Time: 11:00- 12:00   Participation Level: Active   Behavioral Response: CasualAlertDepressed   Type of Therapy: Group Therapy, OT   Treatment Goals addressed: Coping   Interventions: Psychosocial skills training, Supportive   Summary: Occupational Therapy group   Therapist Response: Patient engaged in group when prompted. See OT note.         Session Time: 12:00 -1:00   Participation Level: Active   Behavioral Response: CasualAlertDepressed   Type of Therapy: Group therapy   Treatment Goals addressed: Coping   Interventions: CBT; Solution focused; Supportive; Reframing   Summary: 12:00 - 12:50: Clinician led introduced the topic of "Radical Acceptance". Patients identified and discussed areas of life where radical acceptance would be useful.   12:50 -1:00 Clinician led check-out. Clinician assessed for immediate needs, medication compliance and efficacy,  and safety concerns   Therapist Response: 12:00 - 12:50: Pt engaged in discussion when prompted. Pt identified radical acceptance could be useful when focusing on not having control over everything in  life. 12:50 - 1:00: At check-out, patient rates her mood at a 9 on a scale of 1-10 with 10 being great. Pt reports afternoon plans of hanging out with his wife. Pt demonstrates some progress as evidenced by increased insight. Patient denies SI/HI at the end of group.  Suicidal/Homicidal: Nowithout intent/plan  Plan: Pt will continue in PHP while working to decrease depression symptoms, increase emotional regulation, and increase ability to manage symptoms in a healthy manner.   Diagnosis: Severe episode of recurrent major depressive disorder, without psychotic features (HCC) [F33.2]    1. Severe episode of recurrent major depressive disorder, without psychotic features [facility])       Maurice George, [facility] 01/24/2021

## 2021-01-27 NOTE — Psych (Addendum)
Virtual Visit via Video Note  I connected with Maurice George on 01/25/21 at  9:00 AM EDT by a video enabled telemedicine application and verified that I am speaking with the correct person using two identifiers.  Location: Patient: Home Provider: Clinical Home Office   I discussed the limitations of evaluation and management by telemedicine and the availability of in person appointments. The patient expressed understanding and agreed to proceed.  Follow Up Instructions:    I discussed the assessment and treatment plan with the patient. The patient was provided an opportunity to ask questions and all were answered. The patient agreed with the plan and demonstrated an understanding of the instructions.   The patient was advised to call back or seek an in-person evaluation if the symptoms worsen or if the condition fails to improve as anticipated.  I provided 240 minutes of non-face-to-face time during this encounter.   Maurice George, [facility]    [facility] BH PHP THERAPIST PROGRESS NOTE  Maurice George [id number]  Session Time: 9-10  Participation Level: Active  Behavioral Response: CasualAlertAnxious and Depressed  Type of Therapy: Group Therapy  Treatment Goals addressed: Coping  Interventions: CBT, DBT, Solution Focused, Strength-based, Supportive, and Reframing  Summary: Clinician led check-in regarding current stressors and situation. Clinician utilized active listening and empathetic response and validated patient emotions. Clinician facilitated processing group on pertinent issues.  Patient arrived within time allowed and reports that he is feeling "not good." Patient rates his mood at a 3 on a scale of 1-10 with 10 being great. Pt reports he thinks he has a sinus infection. Pt reports good afternoon hanging out with his wife. Pt reports being very grateful for the support he has in his wife. Pt able to process. Pt engaged in discussion.         Session Time: 10:00 -  11:00   Participation Level: Active   Behavioral Response: CasualAlertDepressed   Type of Therapy: Group Therapy   Treatment Goals addressed: Coping   Interventions: CBT, DBT, Supportive and Reframing   Summary: Cln introduced topic of Positive Psychology. Group watched "Positive Psychology" Ted-Talk. Patients discussed how their "lens" of life effects the way they feel. Patients discussed how moving the "goal posts" of life push happiness over the "cognitive horizon." Group discussed 5 strategies to help change lens. Patients identified one strategy they would be willing to try to change their "lens" for at least 21 days to create a new habit.      Therapist Response: Pt engaged in discussion and is able to understand how moving the goal posts of life can make happiness and joy unattainable at times. Pt reports he will try positive journaling to help change his lens.         Session Time: 11:00 -12:00   Participation Level: Active   Behavioral Response: CasualAlertDepressed   Type of Therapy: Group therapy   Treatment Goals addressed: Coping   Interventions: Supportive   Summary: Owens Loffler, led group on topic of strengths.   Therapist Response: Pt engaged in discussion. See [place]in note.    Session Time: 12:00- 1:00   Participation Level: Active   Behavioral Response: CasualAlertDepressed   Type of Therapy: Group Therapy, OT   Treatment Goals addressed: Coping   Interventions: Psychosocial skills training, Supportive   Summary: 12:00-12:50: Occupational Therapy group 12:50 -1:00 Clinician led check-out. Clinician assessed for immediate needs, medication compliance and efficacy, and safety concerns  Therapist Response: 12:00-12:50 Patient engaged in group. See OT  note.   12:50 - 1:00: At check-out, patient rates his mood at a 7 on a scale of 1-10 with 10 being great. Pt reports afternoon plans of working on his garage and ping-pong table. Pt  demonstrates some progress as evidenced by increased ability to manage anger. Patient denies SI/HI at the end of group.  Suicidal/Homicidal: Nowithout intent/plan  Plan: Pt will continue in PHP while working to decrease depression symptoms, increase emotional regulation, and increase ability to manage symptoms in a healthy manner.   Diagnosis: Severe episode of recurrent major depressive disorder, without psychotic features (HCC) [F33.2]    1. Severe episode of recurrent major depressive disorder, without psychotic features [facility])       Maurice George, [facility] 01/25/2021

## 2021-01-28 ENCOUNTER — Other Ambulatory Visit: Payer: Self-pay

## 2021-01-28 ENCOUNTER — Other Ambulatory Visit (HOSPITAL_COMMUNITY): Payer: 59 | Admitting: Professional

## 2021-01-28 ENCOUNTER — Other Ambulatory Visit (HOSPITAL_COMMUNITY): Payer: 59 | Admitting: Occupational Therapy

## 2021-01-28 ENCOUNTER — Encounter (HOSPITAL_COMMUNITY): Payer: Self-pay

## 2021-01-28 DIAGNOSIS — F332 Major depressive disorder, recurrent severe without psychotic features: Secondary | ICD-10-CM

## 2021-01-28 DIAGNOSIS — R4589 Other symptoms and signs involving emotional state: Secondary | ICD-10-CM

## 2021-01-28 DIAGNOSIS — R41844 Frontal lobe and executive function deficit: Secondary | ICD-10-CM

## 2021-01-28 NOTE — Psych (Addendum)
Virtual Visit via Video Note  I connected with Maurice George on 01/26/21 at  9:00 AM EDT by a video enabled telemedicine application and verified that I am speaking with the correct person using two identifiers.  Location: Patient: Home Provider: Clinical Home Office   I discussed the limitations of evaluation and management by telemedicine and the availability of in person appointments. The patient expressed understanding and agreed to proceed.  Follow Up Instructions:    I discussed the assessment and treatment plan with the patient. The patient was provided an opportunity to ask questions and all were answered. The patient agreed with the plan and demonstrated an understanding of the instructions.   The patient was advised to call back or seek an in-person evaluation if the symptoms worsen or if the condition fails to improve as anticipated.  I provided 240 minutes of non-face-to-face time during this encounter.   [name], [facility]    [facility] BH PHP THERAPIST PROGRESS NOTE  Maurice George [id number]  Session Time: 9-10  Participation Level: Active  Behavioral Response: CasualAlertEuthymic  Type of Therapy: Group Therapy  Treatment Goals addressed: Coping  Interventions: CBT, DBT, Solution Focused, Strength-based, Supportive, and Reframing  Summary: Clinician led check-in regarding current stressors and situation. Clinician utilized active listening and empathetic response and validated patient emotions. Clinician facilitated processing group on pertinent issues.  Therapist Response: Patient arrived within time allowed and reports that he is feeling "really good." Patient rates his mood at an 8 on a scale of 1-10 with 10 being great. Pt reports he worked on his ping-pong table, did his positive journaling, and spent time with his wife. Pt reports he has struggled with reassurance of being loved from his wife in the past and is trying to do better about needing this  reassurance. Pt able to process. Pt engaged in discussion.        Session Time: 10:00 - 11:00   Participation Level: Active   Behavioral Response: CasualAlertEuthymic   Type of Therapy: Group Therapy   Treatment Goals addressed: Coping   Interventions: CBT, DBT, Supportive and Reframing   Summary: Clinician led discussion on ways in which fear of rejection can hold [place] back.    Therapist Response: Pt engaged in discussion and is able to see how the fear of rejection can lead to lost opportunities.       Session Time: 11:00 -12:00   Participation Level: Active   Behavioral Response: CasualAlertEuthymic   Type of Therapy: Group therapy   Treatment Goals addressed: Coping   Interventions: Supportive   Summary: Owens Loffler, led group on topic of community and values.   Therapist Response: Pt engaged in discussion. See Cha[place]in note.    Session Time: 12:00- 1:00   Participation Level: Active   Behavioral Response: CasualAlertEuthymic   Type of Therapy: Group Therapy   Treatment Goals addressed: Coping   Interventions: CBT, DBT, Supportive and Reframing   Summary: 12:00-12:50: Clinician introduced topic of "Mindfulness." discussed the purpose of mindfulness, what it means, that it is a skill, and the "What" and "How" skills. 12:50 -1:00 Clinician led check-out. Clinician assessed for immediate needs, medication compliance and efficacy, and safety concerns  Therapist Response: 12:00-12:50 Patient engaged in group. Patient reports understanding how mindfulness could be beneficial.   12:50 - 1:00: At check-out, patient rates his mood at a 10 on a scale of 1-10 with 10 being great. Pt reports afternoon plans of chores and thinking about future tattoos he may  get. Pt demonstrates some progress as evidenced by increased insight into his needs. Patient denies SI/HI at the end of group.  Suicidal/Homicidal: Nowithout intent/plan  Plan: Pt will continue  in PHP while working to decrease depression symptoms, increase emotional regulation, and increase ability to manage symptoms in a healthy manner.   Diagnosis: Severe episode of recurrent major depressive disorder, without psychotic features (HCC) [F33.2]    1. Severe episode of recurrent major depressive disorder, without psychotic features (HCC)   2. Difficulty coping       [name], [facility] 01/26/2021

## 2021-01-28 NOTE — Psych (Signed)
Virtual Visit via Video Note  I connected with Maurice George on 01/27/21 at  9:00 AM EDT by a video enabled telemedicine application and verified that I am speaking with the correct person using two identifiers.  Location: Patient: Home Provider: Clinical Home Office   I discussed the limitations of evaluation and management by telemedicine and the availability of in person appointments. The patient expressed understanding and agreed to proceed.  Follow Up Instructions:    I discussed the assessment and treatment plan with the patient. The patient was provided an opportunity to ask questions and all were answered. The patient agreed with the plan and demonstrated an understanding of the instructions.   The patient was advised to call back or seek an in-person evaluation if the symptoms worsen or if the condition fails to improve as anticipated.  I provided 240 minutes of non-face-to-face time during this encounter.   [name], [facility]    [facility] BH PHP THERAPIST PROGRESS NOTE  Maurice George [id number]  Session Time: 9-10  Participation Level: Active  Behavioral Response: CasualAlertAnxious  Type of Therapy: Group Therapy  Treatment Goals addressed: Coping  Interventions: CBT, DBT, Solution Focused, Strength-based, Supportive, and Reframing  Summary: Clinician led check-in regarding current stressors and situation. Clinician utilized active listening and empathetic response and validated patient emotions. Clinician facilitated processing group on pertinent issues.  Therapist Response: Patient arrived within time allowed and reports that he is feeling "pretty good." Patient rates his mood at a 7 on a scale of 1-10 with 10 being great. Pt reports he finished cleaning his garage and painting his ping-pong table. Pt reports struggling with continued black and white thinking. Pt able to process. Pt engaged in discussion.        Session Time: 10:00 - 11:00    Participation Level: Active   Behavioral Response: CasualAlertAnxious   Type of Therapy: Group Therapy   Treatment Goals addressed: Coping   Interventions: CBT, DBT, Supportive and Reframing   Summary: Clinician led discussion codependence and the "warning signs" of codependency.     Therapist Response: Pt engaged in discussion and reports understanding codependency. Pt reports he needs to work on codependence in his own life.       Session Time: 11:00 -12:00   Participation Level: Active   Behavioral Response: CasualAlertEuthymic   Type of Therapy: Group therapy   Treatment Goals addressed: Coping   Interventions: Supportive   Summary: Cln introduced the cognitive triangle. Group spent time discussing how our thoughts, behaviors, and emotions about situations can affect how we handle problems and situations.   Therapist Response: Pt engaged in discussion. Pt reports understanding cognitive triangle. Pt able to identify how behaviors, thoughts, and emotions have affected how a situation was handled.    Session Time: 12:00- 1:00   Participation Level: Active   Behavioral Response: CasualAlertEuthymic   Type of Therapy: Group Therapy, OT   Treatment Goals addressed: Coping   Interventions: Psychosocial skills training, Supportive   Summary: 12:00-12:50: Occupational Therapy group 12:50 -1:00 Clinician led check-out. Clinician assessed for immediate needs, medication compliance and efficacy, and safety concerns  Therapist Response: 12:00-12:50 Patient engaged in group. See OT note.   12:50 - 1:00: At check-out, patient rates his mood at a 10 on a scale of 1-10 with 10 being great. Pt reports afternoon plans of chopping and stacking wood. Pt demonstrates some progress as evidenced by increased ability to manage anxiety using skills. Patient denies SI/HI at the end of group.  Suicidal/Homicidal:  Nowithout intent/plan  Plan: Pt will continue in PHP while working to  decrease depression symptoms, increase emotional regulation, and increase ability to manage symptoms in a healthy manner.   Diagnosis: Severe episode of recurrent major depressive disorder, without psychotic features (HCC) [F33.2]    1. Severe episode of recurrent major depressive disorder, without psychotic features [facility])     [name], [facility] 01/27/2021

## 2021-01-28 NOTE — Psych (Signed)
Virtual Visit via Video Note  I connected with Maurice George on 01/28/21 at  9:00 AM EDT by a video enabled telemedicine application and verified that I am speaking with the correct person using two identifiers.  Location: Patient: Home Provider: Clinical Home Office   I discussed the limitations of evaluation and management by telemedicine and the availability of in person appointments. The patient expressed understanding and agreed to proceed.  Follow Up Instructions:    I discussed the assessment and treatment plan with the patient. The patient was provided an opportunity to ask questions and all were answered. The patient agreed with the plan and demonstrated an understanding of the instructions.   The patient was advised to call back or seek an in-person evaluation if the symptoms worsen or if the condition fails to improve as anticipated.  I provided 240 minutes of non-face-to-face time during this encounter.   [name], [facility]    [facility] BH PHP THERAPIST PROGRESS NOTE  Maurice George [id number]  Session Time: 9-10  Participation Level: Active  Behavioral Response: CasualAlertAnxious  Type of Therapy: Group Therapy  Treatment Goals addressed: Coping  Interventions: CBT, DBT, Solution Focused, Strength-based, Supportive, and Reframing  Summary: Clinician led check-in regarding current stressors and situation. Clinician utilized active listening and empathetic response and validated patient emotions. Clinician facilitated processing group on pertinent issues.  Therapist Response: Patient arrived within time allowed and reports that he is feeling "good but anxious." Patient rates his mood at a 6 on a scale of 1-10 with 10 being great. Pt reports he didn't sleep well due to his pregnant wife not feeling well. Pt reports his anxiety is raised due to this. Pt shares she has an appointment this morning to be checked out. Pt able to process. Pt engaged in discussion.         Session Time: 10:00 - 11:00   Participation Level: Active   Behavioral Response: CasualAlertAnxious   Type of Therapy: Group Therapy   Treatment Goals addressed: Coping   Interventions: CBT, DBT, Supportive and Reframing   Summary: Clinician continued topic of cognitive distortions from previous day. Group discussed common cognitive distortions. Group discussed how to "catch, challenge, and change" these cognitive distortions.    Therapist Response: Pt engaged in discussion. Pt identified overgeneralization, black/white thinking, and should statements as issues for self.       Session Time: 11:00 -12:00   Participation Level: Active   Behavioral Response: CasualAlertEuthymic   Type of Therapy: Group therapy   Treatment Goals addressed: Coping   Interventions: Supportive   Summary: Cln continued topic of cognitive distortions. Group participated in activity to identify cognitive distortions and which skills to use to challenge the distortions.   Therapist Response: Pt engaged in discussion. Pt reports understanding how to challenge cognitive distortions and the importance of doing so.     Session Time: 12:00- 1:00   Participation Level: Active   Behavioral Response: CasualAlertEuthymic   Type of Therapy: Group Therapy, OT   Treatment Goals addressed: Coping   Interventions: Psychosocial skills training, Supportive   Summary: 12:00-12:50: Occupational Therapy group 12:50 -1:00 Clinician led check-out. Clinician assessed for immediate needs, medication compliance and efficacy, and safety concerns  Therapist Response: 12:00-12:50 Patient engaged in group. See OT note.   12:50 - 1:00: At check-out, patient rates his mood at an 8 on a scale of 1-10 with 10 being great. Pt reports weekend plans of spending time with wife and chopping wood. Pt demonstrates some progress  as evidenced by increased insight into negative thought patterns. Patient denies SI/HI at the  end of group.  Suicidal/Homicidal: Nowithout intent/plan  Plan: Patient will discharge from PHP due to meeting goals of gaining coping skills, decreased depression and anxiety symptoms. Progress was measured by observation, self-report, and scales. Provider has approved discharge and patient reports alignment with discharge plan. Patient will step down to individual counseling and medication management. Pt is scheduled to step down to IOP on 01/31/21.  Patient denies any SI/HI at time of discharge.  Diagnosis: Severe episode of recurrent major depressive disorder, without psychotic features (HCC) [F33.2]    1. Severe episode of recurrent major depressive disorder, without psychotic features [facility])     [name], [facility] 01/28/2021

## 2021-01-28 NOTE — Therapy (Signed)
[address] [place], [place], [address] Phone: [phone]   Fax:  [phone] Virtual Visit via Video Note  I connected with Maurice George on 01/28/21 at  12:00 PM EDT by a video enabled telemedicine application and verified that I am speaking with the correct person using two identifiers.  Location: Patient: Patient Home Provider: Home Office   I discussed the limitations of evaluation and management by telemedicine and the availability of in person appointments. The patient expressed understanding and agreed to proceed.   I discussed the assessment and treatment plan with the patient. The patient was provided an opportunity to ask questions and all were answered. The patient agreed with the plan and demonstrated an understanding of the instructions.   The patient was advised to call back or seek an in-person evaluation if the symptoms worsen or if the condition fails to improve as anticipated.  I provided 50 minutes of non-face-to-face time during this encounter.   Ponciano Ort, OT/L   Occupational Therapy Treatment  Patient Details  Name: Maurice George MRN: [id number] Date of Birth: 07-04-1985 Referring Provider (OT): Ricky Ala   Encounter Date: 01/28/2021   OT End of Session - 01/28/21 1318     Visit Number 9    Number of Visits 20    Date for OT Re-Evaluation 02/08/21    Authorization Type [employer]    OT Start Time 1200    OT Stop Time 1250    OT Time Calculation (min) 50 min    Activity Tolerance Patient tolerated treatment well    Behavior During Therapy WFL for tasks assessed/performed             Past Medical History:  Diagnosis Date   Abnormal liver enzymes    Anal fissure    Anxiety    Asthma    Backache, unspecified    Depression    Diabetes mellitus ([place])    H/O hemorrhoids    History of chicken pox    Hyperlipidemia    Hypertension    Obesity    Other  abnormal glucose    Sleep apnea     Past Surgical History:  Procedure Laterality Date   TONSILLECTOMY  1999    There were no vitals filed for this visit.   Subjective Assessment - 01/28/21 1317     Currently in Pain? No/denies                                  OT Education - 01/28/21 1317     Education Details Continued education on different factors that contribute to our ability to manage our time, along with specific time management tips/strategies, including procrastination    Person(s) Educated Patient    Methods Explanation;Handout    Comprehension Verbalized understanding              OT Short Term Goals - 01/28/21 1318       OT SHORT TERM GOAL #1   Title Pt will actively engage in OT group sessions throughout duration of PHP programming, in order to promote daily structure, social engagement, and opportunities to develop and utilize adaptive strategies to maximize functional performance in preparation for safe transition and integration back into school, work, and the community.    Status Achieved      OT SHORT TERM GOAL #2   Title Pt will demonstrate improved ability  to communicate feelings/needs/wants, without being angry/irritable/aggressive, as evidenced by, active participation in OT sessions, throughout duration of PHP programming, in order to safely transition back into the community at discharge.    Status Achieved      OT SHORT TERM GOAL #3   Title Pt will identify 1-3 stress management strategies he can utilize, in order to safely manage increased psychosocial stressors identified, with min cues, in preparation for safe transition back to the community at discharge.    Status Achieved           Group Session:  S: "I used to have a hard time delegating tasks and I would just get angry and yell. I learned you have to have patience and understanding for the other person's perspective."  O: Group began with a recap on  strategies/tips learned from previous OT session focused on time management. Today's group continued to focus on topic of time management and reviewed additional strategies to manage and create schedules that are more efficient and effective in meeting their needs  A: Maurice George was active and independent in his participation of discussion and activity, sharing that he has learned to delegate tasks more appropriately, asking his wife and coming from a place of understanding and patience,rather than anger. Pt also shared that his time management as improved lately and identifies communication as something that has played a major role. Appeared receptive to additional education and strategies reviewed.   P: Pt will discharge from PHP, effective today, with plan to begin [facility] on Monday.  OCCUPATIONAL THERAPY DISCHARGE SUMMARY  Visits from Start of Care: 9  Current functional level related to goals / functional outcomes: Maurice George has achieved 3/3 OT Goals during his time in the [facility] program, demonstrating improvement in his ability to safely manage his anger, and finding strategies to manage his stress and communicate effectively. Pt is ready and agreeable to discharge at this time, effective 01/28/21 with plan to follow up and begin the [facility] on Monday 01/31/2021 for ongoing treatment and therapy needs.    Remaining deficits: See above   Education / Equipment: See above   Patient agrees to discharge. Patient goals were met. Patient is being discharged due to meeting the stated rehab goals..      Plan - 01/28/21 1318     Occupational performance deficits (Please refer to evaluation for details): ADL's;IADL's;Rest and Sleep;Education;Work;Leisure;Social Participation    Body Structure / Function / Physical Skills ADL    Cognitive Skills Attention;Emotional;Energy/Drive;Learn;Memory;Perception;Problem Solve;Safety Awareness;Temperament/Personality;Thought;Understand    Psychosocial Skills Coping  Strategies;Environmental  Adaptations;Habits;Interpersonal Interaction;Routines and Behaviors             Patient will benefit from skilled therapeutic intervention in order to improve the following deficits and impairments:   Body Structure / Function / Physical Skills: ADL Cognitive Skills: Attention, Emotional, Energy/Drive, Learn, Memory, Perception, Problem Solve, Safety Awareness, Temperament/Personality, Thought, Understand Psychosocial Skills: Coping Strategies, Environmental  Adaptations, Habits, Interpersonal Interaction, Routines and Behaviors   Visit Diagnosis: Difficulty coping  Frontal lobe and executive function deficit  Severe episode of recurrent major depressive disorder, without psychotic features [facility])    Problem List Patient Active Problem List   Diagnosis Date Noted   Major depressive disorder, recurrent episode, severe ([place]) 01/17/2021   MDD (major depressive disorder), recurrent, in full remission ([place]) 08/11/2020   RLS (restless legs syndrome) 07/30/2020   Cannabis use disorder, severe, in early remission ([place]) 07/07/2020   Internal and external bleeding hemorrhoids 04/01/2020   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) 10/16/2013   Atrial tachycardia ([place]) 05/29/2013   Chest pain 05/29/2013   Morbid obesity ([place]) 05/29/2013   Hyperlipidemia 05/29/2013   OSA (obstructive sleep apnea) 05/29/2013    01/28/2021   Ponciano Ort, MOT, OTR/L  01/28/2021, 1:19 PM  [facility] HOSPITALIZATION PROGRAM [place], [place], [address] Phone: [phone]   Fax:  [phone]  Name: Maurice George MRN: [id number] Date of Birth: [birth year]

## 2021-01-31 ENCOUNTER — Other Ambulatory Visit: Payer: Self-pay

## 2021-01-31 ENCOUNTER — Other Ambulatory Visit (HOSPITAL_COMMUNITY): Payer: 59 | Attending: Psychiatry | Admitting: Psychiatry

## 2021-01-31 ENCOUNTER — Encounter (HOSPITAL_COMMUNITY): Payer: Self-pay | Admitting: Psychiatry

## 2021-01-31 DIAGNOSIS — Z79899 Other long term (current) drug therapy: Secondary | ICD-10-CM | POA: Diagnosis not present

## 2021-01-31 DIAGNOSIS — G4700 Insomnia, unspecified: Secondary | ICD-10-CM | POA: Diagnosis not present

## 2021-01-31 DIAGNOSIS — F332 Major depressive disorder, recurrent severe without psychotic features: Secondary | ICD-10-CM | POA: Insufficient documentation

## 2021-01-31 DIAGNOSIS — F602 Antisocial personality disorder: Secondary | ICD-10-CM | POA: Insufficient documentation

## 2021-01-31 DIAGNOSIS — Z7984 Long term (current) use of oral hypoglycemic drugs: Secondary | ICD-10-CM | POA: Insufficient documentation

## 2021-01-31 DIAGNOSIS — F4310 Post-traumatic stress disorder, unspecified: Secondary | ICD-10-CM | POA: Insufficient documentation
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
## 2021-02-01 ENCOUNTER — Other Ambulatory Visit: Payer: Self-pay

## 2021-02-01 ENCOUNTER — Other Ambulatory Visit (HOSPITAL_COMMUNITY): Payer: 59 | Admitting: Licensed Clinical Social Worker

## 2021-02-01 DIAGNOSIS — F332 Major depressive disorder, recurrent severe without psychotic features: Secondary | ICD-10-CM | POA: Diagnosis not present
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
## 2021-02-01 NOTE — Progress Notes (Signed)
Virtual Visit via Video Note   I connected with [name] on 02/01/21 at  9:00 AM EDT by a video enabled telemedicine application and verified that I am speaking with the correct person using two identifiers.   At orientation to the IOP program, Case Manager discussed the limitations of evaluation and management by telemedicine and the availability of in person appointments. The patient expressed understanding and agreed to proceed with virtual visits throughout the duration of the program.   Location:  Patient: Patient Home Provider: [facility]   History of Present Illness: MDD   Observations/Objective: Check In: Case Manager checked in with all participants to review discharge dates, insurance authorizations, work-related documents and needs from the treatment team regarding medications. Ules stated needs and engaged in discussion.    Initial Therapeutic Activity: Counselor facilitated a check-in with Yug to assess for safety, sobriety and medication compliance.  Clinician also inquired about Aveion's current emotional ratings, as well as any significant changes in thoughts, feelings or behavior since previous check in.  Brentin presented for session on time and was alert, oriented x5, with no evidence or self-report of SI/HI or A/V H.  Timmothy reported compliance with medication and denied use of alcohol or illicit substances.  Elzia reported scores of 1/10 for depression, 3/10 for anxiety, and 4/10 for anger/irritability.  Avyay denied any recent panic attacks or outbursts.  Jeffie reported recent success of staying productive, and getting some remodeling work done in the home ahead of the newborn arriving in upcoming weeks.  Curlie reported that his current struggle is feeling like he has been overloading himself with tasks, which has led to more irritability, stating "I'm nervous about going back to the angry person I was".  Darrold reported that he would work to find a  healthy balance between productivity and rest to address this.         Second Therapeutic Activity: Counselor introduced [employer], [place] Chaplain to provide psychoeducation on topic of Grief and Loss with members today.  Marchelle Folks began discussion by checking in with the group about their baseline mood today, general thoughts on what grief means to them and how it has affected them personally in the past.  Marchelle Folks provided information on how the process of grief/loss can differ depending upon one's unique culture, and categories of loss one could experience (i.e. loss of a person, animal, relationship, job, identity, etc).  Marchelle Folks encouraged members to be mindful of how pervasive loss can be, and how to recognize signs which could indicate that this is having an impact on one's overall mental health and wellbeing.  Intervention effectiveness could not be measured, as Arther's camera was off during this portion of group, and he did not engage in conversation.     Third Therapeutic Activity: Counselor covered topic of core beliefs with group today.  Counselor virtually shared a handout on the subject, which explained how everyone looks at the world differently, and two people can have the same experience, but have different interpretations of what happened.  Members were encouraged to think of these like sunglasses with different "shades" influencing perception towards positive or negative outcomes.  Examples of negative core beliefs were provided, such as "I'm unlovable", "I'm not good enough", and "I'm a bad person".  Members were asked to share which one(s) they could relate to, and then identify evidence which contradicts these beliefs.  Counselor also provided psychoeducation on positive affirmations today.  Counselor explained how these are positive statements  which can be spoken out loud or recited mentally to challenge negative thoughts and/or core beliefs to improve mood and outlook each day.   Counselor shared a comprehensive list of affirmations virtually to members with different categories, including ones for health, confidence, success, and happiness.  Counselor invited members to look through this list and identify any which resonated with them, and practice saying them out loud with sincerity.  Intervention was effective, as evidenced by [name] participating in discussion on subject, identifying a core belief affecting his self-esteem ("I am a bad person"), as well as evidence which contradicted this belief, such as his ongoing dedication to supporting his wife and children, and commitment to improving ability to cope with anger via engagement in treatment.     Assessment and Plan: Counselor recommends that Fount remain in IOP treatment to better manage mental health symptoms, ensure stability and pursue completion of treatment plan goals. Counselor recommends adherence to crisis/safety plan, taking medications as prescribed, and following up with medical professionals if any issues arise.     Follow Up Instructions: Counselor will send Webex link for next session. Kiah was advised to call back or seek an in-person evaluation if the symptoms worsen or if the condition fails to improve as anticipated.     I provided 180 minutes of non-face-to-face time during this encounter.     Noralee Stain, LCSW, LCAS 02/01/21

## 2021-02-02 ENCOUNTER — Other Ambulatory Visit (HOSPITAL_COMMUNITY): Payer: 59 | Admitting: Psychiatry

## 2021-02-02 ENCOUNTER — Encounter (HOSPITAL_COMMUNITY): Payer: Self-pay | Admitting: Licensed Clinical Social Worker

## 2021-02-02 ENCOUNTER — Other Ambulatory Visit: Payer: Self-pay

## 2021-02-02 DIAGNOSIS — F332 Major depressive disorder, recurrent severe without psychotic features: Secondary | ICD-10-CM | POA: Diagnosis not present

## 2021-02-02 NOTE — Progress Notes (Signed)
Virtual Visit via Video Note   I connected with Maurice George on 02/02/21 at  9:00 AM EDT by a video enabled telemedicine application and verified that I am speaking with the correct person using two identifiers.   At orientation to the IOP program, Case Manager discussed the limitations of evaluation and management by telemedicine and the availability of in person appointments. The patient expressed understanding and agreed to proceed with virtual visits throughout the duration of the program.   Location:  Patient: Patient Home Provider: OPT BH Office   History of Present Illness: MDD   Observations/Objective: Check In: Case Manager checked in with all participants to review discharge dates, insurance authorizations, work-related documents and needs from the treatment team regarding medications. Maurice George stated needs and engaged in discussion.    Initial Therapeutic Activity: Counselor facilitated a check-in with Maurice George to assess for safety, sobriety and medication compliance.  Clinician also inquired about Maurice George's current emotional ratings, as well as any significant changes in thoughts, feelings or behavior since previous check in.  Maurice George presented for session on time and was alert, oriented x5, with no evidence or self-report of SI/HI or A/V H.  Maurice George reported compliance with medication and denied use of alcohol or illicit substances.  Maurice George reported scores of 7/10 for depression, 9/10 for anxiety, and 8/10 for anger/irritability.  Maurice George denied any recent panic attacks, but reported experienced one outburst yesterday which involved yelling.  Maurice George reported that this was triggered by frustration regarding having to wait on paperwork before he can return to work schedule.  Maurice George reported that he plans to work on "Accepting the things I can't change" in order to better manage feelings of anger.     Second Therapeutic Activity: Counselor introduced Maurice George, [employer] Pharmacist, to  provide psychoeducation on topic of medication compliance with members today.  Maurice George provided psychoeducation on classes of medications such as antidepressants, antipsychotics, what symptoms they are intended to treat, and any side effects one might encounter while on a particular prescription.  Time was allowed for clients to ask any questions they might have of [facility] regarding this specialty.  Intervention effectiveness could not be measured, as Maurice George's camera was off during this discussion, and he did not engage with pharmacist.     Third Therapeutic Activity: Counselor discussed topic of gratitude journaling with members as a form of self-care.  Counselor virtually shared a handout with the group today which explained the benefits of this practice, including reduction in stress, increased happiness, and self-esteem.  Tips were also provided to aid in practice, such as taking time with entries, writing about people one is grateful for, and setting goal for two entries per week for at least 10-20 minutes at a time.  Counselor also provided group members with a variety of journaling prompts to choose from today, and encouraged each member to take time to write about something they are grateful for, with examples such as "Something beautiful I recently saw was..", "Something I can be proud of is.", "A reason to be excited for the future is." and more.  Members were encouraged to share their entry with the group, along with their perspective on the activity and motivation level towards making this a habit.  Intervention was effective, as evidenced by Maurice George participating in gratitude journaling exercise, and choosing prompt "A valuable lesson that I learned.".  Maurice George reported that the lesson he learned was to trust his wife's judgment more often, as she has often provided helpful advice when  he is stressed, stating "She is teaching me to be patient, slow down, and take time for myself before things can get out  of control".     Assessment and Plan: Counselor recommends that Maurice George remain in IOP treatment to better manage mental health symptoms, ensure stability and pursue completion of treatment plan goals. Counselor recommends adherence to crisis/safety plan, taking medications as prescribed, and following up with medical professionals if any issues arise.     Follow Up Instructions: Counselor will send Webex link for next session. Maurice George was advised to call back or seek an in-person evaluation if the symptoms worsen or if the condition fails to improve as anticipated.     I provided 180 minutes of non-face-to-face time during this encounter.     Noralee Stain, LCSW, LCAS 02/02/21
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
## 2021-02-03 ENCOUNTER — Other Ambulatory Visit (HOSPITAL_COMMUNITY): Payer: 59 | Admitting: Psychiatry

## 2021-02-03 ENCOUNTER — Other Ambulatory Visit: Payer: Self-pay

## 2021-02-03 ENCOUNTER — Encounter: Payer: Self-pay | Admitting: Orthopedic Surgery

## 2021-02-03 ENCOUNTER — Telehealth (HOSPITAL_COMMUNITY): Payer: Self-pay | Admitting: Psychiatry

## 2021-02-03 DIAGNOSIS — S62001S Unspecified fracture of navicular [scaphoid] bone of right wrist, sequela: Secondary | ICD-10-CM | POA: Insufficient documentation

## 2021-02-03 DIAGNOSIS — M67432 Ganglion, left wrist: Secondary | ICD-10-CM | POA: Insufficient documentation

## 2021-02-03 DIAGNOSIS — M19131 Post-traumatic osteoarthritis, right wrist: Secondary | ICD-10-CM | POA: Insufficient documentation

## 2021-02-03 NOTE — Progress Notes (Signed)
Office Visit Note   Patient: Maurice George           Date of Birth: [birth year]           MRN: [id number] Visit Date: 01/20/2021              Requested by: Eartha Inch, MD [address] [place],  [place] [address] PCP: Eartha Inch, MD   Assessment & Plan: Visit Diagnoses:  1. 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 Ganglion cyst of wrist, left     Plan: Discussed the nature, prognosis, and treatment options of both degenerative SNAC wrist arthritis and ganglion cysts.  At this point, his left wrist is not bothersome.  His previously noted ganglion has resolved spontaneously.  We discussed that it may come back at which point we could discuss continued monitoring, aspiration, or surgical excision.  Regarding his R wrist arthritis, we discussed treatment options including monitoring, activity modification, oral or topical NSAIDs, corticosteroid injections, and multiple surgical options.  He wants to try activity modification and bracing today.  He can follow up with me as needed.   Follow-Up Instructions: No follow-ups on file.   Orders:  No orders of the defined types were placed in this encounter.  No orders of the defined types were placed in this encounter.     Procedures: No procedures performed   Clinical Data: No additional findings.   Subjective: Chief Complaint  Patient presents with   Other    Left wrist-has ganglion cyst that comes and goes in size Right wrist-prior injury 7 years ago that has been increasing in pain    This is a 35 yo RHD M who presents with complaints involving both wrists.  He notes a volar wrist mass on the left that comes and goes.  He has no complaints regarding that wrist today as the mass has resolved and is not painful.  Regarding his right wrist, he notes an injury to this wrist 7 years ago with resulting intermitted pain and swelling.  He is minimally symptomatic today. He has seen different hand  surgeons for these problems but hasn't had any treatment to date.    Review of Systems  Constitutional: Negative.   Respiratory: Negative.    Cardiovascular: Negative.   Neurological: Negative.     Objective: Vital Signs: There were no vitals taken for this visit.  Physical Exam Constitutional:      Appearance: Normal appearance.  Cardiovascular:     Rate and Rhythm: Normal rate.     Pulses: Normal pulses.  Pulmonary:     Effort: Pulmonary effort is normal.  Skin:    General: Skin is warm and dry.     Capillary Refill: Capillary refill takes less than 2 seconds.  Neurological:     Mental Status: He is alert.    Right Hand Exam   Tenderness  Right hand tenderness location: Minimal TTP at dorsal radial wrist w/out obvious swelling.  Other  Erythema: absent Sensation: normal Pulse: present  Comments:  Mildly limited wrist ROM secondary to pain.  5/5 strength.  SILT throughout.    Left Hand Exam   Tenderness  Left hand tenderness location: No TTP at volar wrist in area of previous ganglion cyst.   Range of Motion  The patient has normal left wrist ROM.  Muscle Strength  The patient has normal left wrist strength.  Other  Erythema: absent Sensation: normal Pulse: present  Specialty Comments:  No specialty comments available.  Imaging: 3V of the right wrist taken a day prior to this visit is reviewed and interprted by me.  It demonstrates a remote scaphoid waist fracture w/ minimal changes at the radioscaphoid joint.    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) 12/24/2019   Rectal bleeding 12/12/2019   Rectal pain 12/12/2019   Anal fissure 12/12/2019   Uncontrolled type 2 diabetes mellitus with hyperglycemia (HCC) 01/01/2019   Essential hypertension 07/05/2018   GAD (generalized anxiety disorder) 03/21/2018   Palpitations 02/27/2017   Morbid obesity with BMI of 60.0-69.9, adult (HCC) 04/14/2016   Tobacco use disorder 04/14/2016   Depression 07/03/2014   Prediabetes 02/10/2014   Psychological factors affecting morbid obesity (HCC) 02/04/2014   Severe cannabis use disorder (HCC) 10/16/2013   Atrial tachycardia (HCC) 05/29/2013   Chest pain 05/29/2013   Morbid obesity (HCC) 05/29/2013   Hyperlipidemia 05/29/2013   OSA (obstructive sleep apnea) 05/29/2013   Past Medical History:  Diagnosis Date   Abnormal liver enzymes    Anal fissure    Anxiety    Asthma    Backache, unspecified    Depression    Diabetes mellitus (HCC)    H/O hemorrhoids    History of chicken pox    Hyperlipidemia    Hypertension    Obesity    Other abnormal glucose    Sleep apnea     Family History  Problem Relation Age of Onset   Clotting disorder Father    Depression Maternal Grandfather    Hypertension Maternal Grandfather    Lung cancer Maternal Grandfather    Diabetes Maternal Grandmother    Hypertension Paternal Grandfather    Heart disease Paternal Grandfather    Heart attack Paternal Grandfather     Past Surgical History:  Procedure Laterality Date   TONSILLECTOMY  1999   Social History   Occupational History   Occupation: Patent attorney  Tobacco Use   Smoking status: Every Day    Packs/day: 0.50    Types: Cigarettes   Smokeless tobacco: Never  Vaping Use   Vaping Use: Never used  Substance and Sexual Activity   Alcohol use: Not Currently   Drug use: Yes    Types: Marijuana   Sexual activity: Yes    Birth  control/protection: None

## 2021-02-04 ENCOUNTER — Other Ambulatory Visit (HOSPITAL_COMMUNITY): Payer: 59

## 2021-02-04 ENCOUNTER — Other Ambulatory Visit: Payer: Self-pay

## 2021-02-07 ENCOUNTER — Other Ambulatory Visit: Payer: Self-pay

## 2021-02-07 ENCOUNTER — Other Ambulatory Visit (HOSPITAL_COMMUNITY): Payer: 59 | Admitting: Licensed Clinical Social Worker

## 2021-02-07 DIAGNOSIS — F332 Major depressive disorder, recurrent severe without psychotic features: Secondary | ICD-10-CM

## 2021-02-07 NOTE — Progress Notes (Signed)
Virtual Visit via Video Note   I connected with Maurice George on 02/07/21 at  9:00 AM EDT by a video enabled telemedicine application and verified that I am speaking with the correct person using two identifiers.   At orientation to the IOP program, Case Manager discussed the limitations of evaluation and management by telemedicine and the availability of in person appointments. The patient expressed understanding and agreed to proceed with virtual visits throughout the duration of the program.   Location:  Patient: [facility] Home Provider: [facility]   History of Present Illness: MDD   Observations/Objective: Check In: Case Manager checked in with all participants to review discharge dates, insurance authorizations, work-related documents and needs from the treatment team regarding medications. Maurice George stated needs and engaged in discussion.    Initial Therapeutic Activity: Counselor facilitated a check-in with Maurice George to assess for safety, sobriety and medication compliance.  Clinician also inquired about Maurice George's current emotional ratings, as well as any significant changes in thoughts, feelings or behavior since previous check in.  Maurice George presented for session on time and was alert, oriented x5, with no evidence or self-report of SI/HI or A/V H.  Maurice George reported compliance with medication and denied use of alcohol or illicit substances.  Maurice George reported scores of 0/10 for depression, 1/10 for anxiety, and 2/10 for anger/irritability.  Maurice George denied any recent panic attacks, but reported "A few small outbursts yesterday".  Maurice George reported that this was triggered by frustration regarding having to wait on paperwork before he can return to work schedule.  Maurice George reported that his recent success was attending a baby shower, and taking care of several household tasks, stating "It was the most productive weekend I have had in a long time".  Maurice George reported that one struggle was managing his  mood, stating "I think both of the outbursts were related to impatience with other people bombarding me with questions when I was already frustrated".  Maurice George reported that he would try to cope more effectively by being mindful of warning signs when he is feeling 'on edge'.       Second Therapeutic Activity: Counselor introduced topic of building social support network today.  Counselor explained how this can be defined as having a having a group of healthy people in one's life you can talk to, spend time with, and get help from to improve both mental and physical health.  Counselor noted that some barriers can make it difficult to connect with other people, including the presence of anxiety or depression, or moving to an unfamiliar area.  Group members were asked to assess the current state of their support network, and identify ways that this could be improved.  Tips were given on how to address previously noted barriers, such as strengthening social skills, using relaxation techniques to reduce anxiety, scheduling social time each week, and/or exploring social events nearby which could increase chances of meeting new supports.  Members were also encouraged to consider getting closer to people they already know through suggestions such as outreaching someone by text, email or phone call if they haven't spoken in awhile, doing something nice for a friend/family member unexpectedly, and/or inviting someone over for a game/movie/dinner night.  Interventions were effective, as evidenced by Maurice George actively participating in discussion on the subject and reporting that he would like to improve overall support, but has dealt with barriers such as social anxiety following move to [place], and stated "I wish I knew some more people with common interests".  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  Maurice George reported that he would also work to improve social skills by being mindful of his speaking volume, pacing, and active listening.      Assessment and Plan: Counselor recommends that Maurice George remain in IOP treatment to better manage mental health symptoms, ensure stability and pursue completion of treatment plan goals. Counselor recommends adherence to crisis/safety plan, taking medications as prescribed, and following up with medical professionals if any issues arise.     Follow Up Instructions: Counselor will send Webex link for next session. Maurice George was advised to call back or seek an in-person evaluation if the symptoms worsen or if the condition fails to improve as anticipated.     I provided 180 minutes of non-face-to-face time during this encounter.     Noralee Stain, LCSW, LCAS 02/07/21

## 2021-02-08 ENCOUNTER — Other Ambulatory Visit (HOSPITAL_COMMUNITY): Payer: 59 | Admitting: Licensed Clinical Social Worker

## 2021-02-08 ENCOUNTER — Other Ambulatory Visit: Payer: Self-pay

## 2021-02-08 DIAGNOSIS — F332 Major depressive disorder, recurrent severe without psychotic features: Secondary | ICD-10-CM

## 2021-02-08 MED ORDER — TRAZODONE HCL 50 MG PO TABS: 50.0000 mg | ORAL_TABLET | Freq: Every evening | ORAL | 0 refills | Status: AC | PRN

## 2021-02-08 MED ORDER — GABAPENTIN 800 MG PO TABS: 800.0000 mg | ORAL_TABLET | Freq: Four times a day (QID) | ORAL | 0 refills | Status: DC

## 2021-02-08 MED ORDER — FLUOXETINE HCL 40 MG PO CAPS: 40.0000 mg | ORAL_CAPSULE | Freq: Every day | ORAL | 0 refills | Status: AC

## 2021-02-08 NOTE — Progress Notes (Signed)
Virtual Visit via Video Note   I connected with [name] on 02/08/21 at  9:00 AM EDT by a video enabled telemedicine application and verified that I am speaking with the correct person using two identifiers.   At orientation to the IOP program, Case Manager discussed the limitations of evaluation and management by telemedicine and the availability of in person appointments. The patient expressed understanding and agreed to proceed with virtual visits throughout the duration of the program.   Location:  Patient: Patient Home Provider: [facility]   History of Present Illness: MDD   Observations/Objective: Check In: Case Manager checked in with all participants to review discharge dates, insurance authorizations, work-related documents and needs from the treatment team regarding medications. Jule stated needs and engaged in discussion.    Initial Therapeutic Activity: Counselor facilitated a check-in with Camila to assess for safety, sobriety and medication compliance.  Clinician also inquired about Tyrone's current emotional ratings, as well as any significant changes in thoughts, feelings or behavior since previous check in.  Joeangel presented for session on time and was alert, oriented x5, with no evidence or self-report of SI/HI or A/V H.  Jantzen reported compliance with medication and denied use of alcohol or illicit substances.  Helio reported scores of 1/10 for depression, 5/10 for anxiety, and 1/10 for anger/irritability.  Sheri denied any recent panic attacks or outbursts since last check-in.  Samule reported that his most recent success was being productive yesterday running errands, including dropping his mother off at the airport, and managing his mood more effectively to avoid outbursts.  Gradyn reported that one struggle was burning his hand on a hot brick, although he agreed to visit urgent care if this does not appear to get better in the next few days.  Travus also  requested to speak with NP about medications, as he worries about running out and not having an appointment with a psychiatrist to manage this post-discharge.  Counselor informed case Production designer, theatre/television/film of this request.         Second Therapeutic Activity: Counselor introduced [employer], [facility] to provide psychoeducation on topic of Grief and Loss with members today.  Marchelle Folks began discussion by checking in with the group about their baseline mood today, general thoughts on what grief means to them and how it has affected them personally in the past.  Marchelle Folks provided information on how the process of grief/loss can differ depending upon one's unique culture, and categories of loss one could experience (i.e. loss of a person, animal, relationship, job, identity, etc).  Marchelle Folks encouraged members to be mindful of how pervasive loss can be, and how to recognize signs which could indicate that this is having an impact on one's overall mental health and wellbeing.  Intervention was effective, as evidenced by [name] participating in discussion with speaker on the subject, reporting that he experienced grief in the past when he had to establish boundaries with toxic friends/family to protect his own mental health, stating "It was hard but I had to cut them out".  Isaac reported that focusing on healthy self-care activities such as hiking and being outdoors helps provide a balance to the grieving process.      Third Therapeutic Activity: Counselor introduced topic of creating mental health maintenance plan today.  Counselor provided handout on subject to members, which stressed the importance of maintaining one's mental health in a similar way to using diet and exercise to ensure physical health.  Counselor walked members through  process of identifying triggers which could worsen symptoms, including specific people, places, and things one needs to avoid.  Members were also tasked with identifying warning signs  such as thoughts, feelings, or behaviors which could indicate mental health is at increased risk.  Counselor also facilitated conversation on self-care activities and coping strategies which members have previously utilized in the past, are currently using in daily routine, or plan to use soon to assist with managing problems or symptoms when/if they appear.  Counselor encouraged members to revisit their maintenance plan often and make changes as needed to ensure day to day stability.  Intervention was effective, as evidenced by [name] successfully completing mental health maintenance plan, which included identifying mental health risks such as allowing small inconveniences to trigger outbursts, lacking patience, and rise in negative thinking, in addition to preventative steps he can take to counter these issues, such as spending time on hobbies like assembling Legos with his wife, going on hikes, and using anger management skills regularly. [place] reported that he would seek readmission to [facility] in the future if he began to notice increase in outbursts, and sleep disruption.    Assessment and Plan: Counselor recommends that Harnoor remain in IOP treatment to better manage mental health symptoms, ensure stability and pursue completion of treatment plan goals. Counselor recommends adherence to crisis/safety plan, taking medications as prescribed, and following up with medical professionals if any issues arise.     Follow Up Instructions: Counselor will send Webex link for next session. Salif was advised to call back or seek an in-person evaluation if the symptoms worsen or if the condition fails to improve as anticipated.     I provided 180 minutes of non-face-to-face time during this encounter.     Noralee Stain, LCSW, LCAS 02/08/21

## 2021-02-08 NOTE — Progress Notes (Signed)
Np provided medication refills x 60 days.

## 2021-02-09 ENCOUNTER — Other Ambulatory Visit (HOSPITAL_COMMUNITY): Payer: 59

## 2021-02-09 ENCOUNTER — Other Ambulatory Visit: Payer: Self-pay

## 2021-02-10 ENCOUNTER — Other Ambulatory Visit: Payer: Self-pay

## 2021-02-10 ENCOUNTER — Other Ambulatory Visit (HOSPITAL_COMMUNITY): Payer: 59

## 2021-02-11 ENCOUNTER — Other Ambulatory Visit (HOSPITAL_COMMUNITY): Payer: 59 | Admitting: Licensed Clinical Social Worker

## 2021-02-11 ENCOUNTER — Other Ambulatory Visit: Payer: Self-pay

## 2021-02-11 DIAGNOSIS — F332 Major depressive disorder, recurrent severe without psychotic features: Secondary | ICD-10-CM

## 2021-02-11 NOTE — Progress Notes (Signed)
Virtual Visit via Video Note   I connected with [name] on 02/11/21 at  9:00 AM EDT by a video enabled telemedicine application and verified that I am speaking with the correct person using two identifiers.   At orientation to the IOP program, Case Manager discussed the limitations of evaluation and management by telemedicine and the availability of in person appointments. The patient expressed understanding and agreed to proceed with virtual visits throughout the duration of the program.   Location:  Patient: [facility] Home Provider: [facility]   History of Present Illness: MDD   Observations/Objective: Check In: Case Manager checked in with all participants to review discharge dates, insurance authorizations, work-related documents and needs from the treatment team regarding medications. Jimie stated needs and engaged in discussion.    Initial Therapeutic Activity: Counselor facilitated a check-in with Kelen to assess for safety, sobriety and medication compliance.  Clinician also inquired about Marcos's current emotional ratings, as well as any significant changes in thoughts, feelings or behavior since previous check in.  Kenzel presented for session on time and was alert, oriented x5, with no evidence or self-report of SI/HI or A/V H.  Jeral reported compliance with medication and denied use of alcohol or illicit substances.  Bynum reported scores of 0/10 for depression, 2/10 for anxiety, and 0/10 for anger/irritability.  Jerol denied any recent panic attacks or outbursts since last check-in.  Faysal reported that a recent success was refinancing his home, which was stressful at times, but he was able to utilize coping skills in order to avoid emotional outbursts.  Chan reported that one struggle has been trying to pace himself more and take things one day at a time.           Second Therapeutic Activity: Psycho-educational portion of group was provided by Virgina Evener, Interior and spatial designer of community education with [employer].  Alexandra provided information on history of her local agency, mission statement, and the variety of unique services offered which group members might find beneficial to engage in, including both virtual and in-person support groups, as well as peer support program for mentoring.  Alexandra offered time to answer member's questions regarding services and encouraged them to consider utilizing these services to assist in working towards their individual wellness goals.   Intervention was effective, as evidenced by [name] following along with speaker's presentation and reporting that he found some of the support groups to be appealing, and would look on the [facility] website later today to see about admission process.     Third Therapeutic Activity: Counselor introduced topic of grounding skills today.  Counselor defined these as simple strategies one can use to help detach from difficult thoughts or feelings temporarily by focusing on something else.  Counselor noted that grounding will not solve the problem at hand, but can provide the practitioner with time to regain control over their thoughts and/or feelings and prevent the situation from getting worse (i.e. interrupting a panic attack).  Counselor divided these into three categories (mental, physical, and soothing) and then provided examples of each which group members could practice during session.  Some of these included describing one's environment in detail or playing a categories game with oneself for mental category, taking a hot bath/shower, stretching, or carrying a grounding object for physical category, and saying kind statements, or visualizing people one cares about for soothing category.  Counselor inquired about which techniques members have used with success in the past, or will commit to learning,  practicing, and applying now to improve coping abilities.  Intervention was  effective, as evidenced by [name] successfully participating in discussion on grounding skills, trying some of these out in session, and expressing receptiveness to several offered, such as thinking of a humorous joke to make himself laugh, playing a categories game with himself such as listing types of computer hardware, clenching his fists, cooking a savory meal, planning a trip to look forward to, or manipulating a grounding object such as a coin he received as a meaningful gift from his mother as a child.   Assessment and Plan: Counselor recommends that Elbie remain in IOP treatment to better manage mental health symptoms, ensure stability and pursue completion of treatment plan goals. Counselor recommends adherence to crisis/safety plan, taking medications as prescribed, and following up with medical professionals if any issues arise.     Follow Up Instructions: Counselor will send Webex link for next session. Teigen was advised to call back or seek an in-person evaluation if the symptoms worsen or if the condition fails to improve as anticipated.     I provided 150 minutes of non-face-to-face time during this encounter.     Noralee Stain, [place], LCAS 02/11/21

## 2021-02-14 ENCOUNTER — Ambulatory Visit: Payer: Self-pay | Admitting: Internal Medicine

## 2021-02-14 ENCOUNTER — Other Ambulatory Visit (HOSPITAL_COMMUNITY): Payer: 59 | Admitting: Psychiatry

## 2021-02-14 ENCOUNTER — Other Ambulatory Visit: Payer: Self-pay

## 2021-02-14 DIAGNOSIS — F332 Major depressive disorder, recurrent severe without psychotic features: Secondary | ICD-10-CM | POA: Diagnosis not present

## 2021-02-14 NOTE — Progress Notes (Signed)
Virtual Visit via Video Note   I connected with Maurice George on 02/14/21 at  9:00 AM EDT by a video enabled telemedicine application and verified that I am speaking with the correct person using two identifiers.   At orientation to the IOP program, Case Manager discussed the limitations of evaluation and management by telemedicine and the availability of in person appointments. The patient expressed understanding and agreed to proceed with virtual visits throughout the duration of the program.   Location:  Patient: [facility] Home Provider: [facility]   History of Present Illness: MDD   Observations/Objective: Check In: Case Manager checked in with all participants to review discharge dates, insurance authorizations, work-related documents and needs from the treatment team regarding medications. Maurice George stated needs and engaged in discussion.    Initial Therapeutic Activity: Counselor facilitated a check-in with Maurice George to assess for safety, sobriety and medication compliance.  Clinician also inquired about Maurice George's current emotional ratings, as well as any significant changes in thoughts, feelings or behavior since previous check in.  Maurice George presented for session on time and was alert, oriented x5, with no evidence or self-report of SI/HI or A/V H.  Maurice George reported compliance with medication and denied use of alcohol or illicit substances.  Maurice George reported scores of 0/10 for depression, 2/10 for anxiety, and 2/10 for anger/irritability.  Maurice George denied any recent panic attacks or outbursts since last check-in.  Maurice George reported that a recent success was controlling his anger and using coping skills to avoid outbursts over the weekend.  Maurice George denied any recent struggles.            Second Therapeutic Activity: Counselor introduced topic of anger management today.  Counselor shared a handout with members on this subject featuring a variety of coping skills, and facilitated discussion on  these approaches.  Examples included raising awareness of anger triggers, practicing deep breathing, keeping an anger log to better understand episodes, using diversion activities to distract oneself for 30 minutes, taking a time out when necessary, and being mindful of warning signs tied to thoughts or behavior.  Counselor inquired about which techniques group members have used before, what has proved to be helpful, what their unique warning signs might be, as well as what they will try out in the future to assist with de-escalation.  Intervention was effective, as evidenced by Maurice George actively participating in discussion on this subject and reporting that he was happy this topic was brought up because it is a primary reason for his entry into [facility] groups.  Maurice George reported that some of his anger triggers include experiencing fear, frustration, or annoyance, being treated unfairly, feeling that someone isn't listening, or having his time wasted, and warning signs of anger rising include having a loud/mean voice, breaking things, experiencing a racing heart, or becoming sweaty.  Maurice George reported high level of motivation for resolving anger issues due to problems this has caused at work and home, and expressed receptiveness to several coping skills offered today, such as keeping an anger journal to monitor outbursts, developing a comprehensive anger management plan, and continuing to practice calming activities like meditation and deep breathing more frequently.    Assessment and Plan: Counselor recommends that Maurice George remain in IOP treatment to better manage mental health symptoms, ensure stability and pursue completion of treatment plan goals. Counselor recommends adherence to crisis/safety plan, taking medications as prescribed, and following up with medical professionals if any issues arise.     Follow Up Instructions: Counselor will send Webex  link for next session. Maurice George was advised to call back or  seek an in-person evaluation if the symptoms worsen or if the condition fails to improve as anticipated.     I provided 180 minutes of non-face-to-face time during this encounter.     Noralee Stain, LCSW, LCAS 02/14/21

## 2021-02-15 ENCOUNTER — Other Ambulatory Visit (HOSPITAL_COMMUNITY): Payer: 59 | Admitting: Psychiatry

## 2021-02-15 ENCOUNTER — Telehealth (HOSPITAL_COMMUNITY): Payer: Self-pay | Admitting: Psychiatry

## 2021-02-15 ENCOUNTER — Other Ambulatory Visit: Payer: Self-pay

## 2021-02-16 ENCOUNTER — Other Ambulatory Visit: Payer: Self-pay

## 2021-02-16 ENCOUNTER — Other Ambulatory Visit (HOSPITAL_COMMUNITY): Payer: 59

## 2021-02-17 ENCOUNTER — Other Ambulatory Visit (HOSPITAL_COMMUNITY): Payer: 59

## 2021-02-17 ENCOUNTER — Other Ambulatory Visit: Payer: Self-pay

## 2021-02-17 NOTE — Psych (Signed)
Virtual Visit via Video Note  I connected with Maurice George on 01/11/21 at  9:00 AM EDT by a video enabled telemedicine application and verified that I am speaking with the correct person using two identifiers.  Location: Patient: patient home Provider: clinical home office   I discussed the limitations of evaluation and management by telemedicine and the availability of in person appointments. The patient expressed understanding and agreed to proceed.  I discussed the assessment and treatment plan with the patient. The patient was provided an opportunity to ask questions and all were answered. The patient agreed with the plan and demonstrated an understanding of the instructions.   The patient was advised to call back or seek an in-person evaluation if the symptoms worsen or if the condition fails to improve as anticipated.  Pt was provided 240 minutes of non-face-to-face time during this encounter.   Donia Guiles, LCSW    [facility] PHP THERAPIST PROGRESS NOTE  Maurice George [id number]  Session Time: 9:00 - 10:00  Participation Level: Active  Behavioral Response: CasualAlertDepressed  Type of Therapy: Group Therapy  Treatment Goals addressed: Coping  Interventions: CBT, DBT, Supportive, and Reframing  Summary: Clinician led check-in regarding current stressors and situation. Clinician utilized active listening and empathetic response and validated patient emotions. Clinician facilitated processing group on pertinent issues.   Therapist Response: Maurice George is a 35 y.o. male who presents with depression symptoms. Patient arrived within time allowed and reports that he is feeling "hopeful." Patient rates his mood at an 4 on a scale of 1-10 with 10 being great. Pt reports he is in pain this morning from wrist issues and that it interfered with his sleep. Pt reports struggling with irritability and anger. Pt able to process. Pt engaged in discussion.         Session  Time: 10:00 - 11:00   Participation Level: Active   Behavioral Response: CasualAlertDepressed   Type of Therapy: Group Therapy   Treatment Goals addressed: Coping   Interventions: CBT, DBT, Supportive and Reframing   Summary: Cln led processing group for pt's current struggles. Group members shared stressors and provided support and feedback. Cln brought in topics of boundaries, healthy relationships, and unhealthy thought processes to inform discussion.      Therapist Response: Pt able to process and provide support to group.         Session Time: 11:00- 12:00   Participation Level: Active   Behavioral Response: CasualAlertAnxiousandDepressed   Type of Therapy: Group Therapy, OT   Treatment Goals addressed: Coping   Interventions: Psychosocial skills training, Supportive   Summary: Occupational Therapy group   Therapist Response: Patient engaged in group. See OT note.           Session Time: 12:00 -1:00   Participation Level: Active    Behavioral Response: CasualAlertAnxiousandDepressed   Type of Therapy: Group therapy   Treatment Goals addressed: Coping   Interventions: CBT; Solution focused; Supportive; Reframing   Summary: 12:00 - 12:50: Cln led discussion on feelings and the role they play for our lives. Cln contextualized feelings as a warning system that brings attention to areas of our lives that need focus. Group members discussed how to pay attention to what the feeling is telling us versus reacting to unpleasant aspects of a feeling.  12:50 -1:00 Clinician led check-out. Clinician assessed for immediate needs, medication compliance and efficacy, and safety concerns   Therapist Response: 12:00 - 12:50: Pt engaged in discussion and reports increased insight.  12:50 -  1:00: At check-out, patient rates his mood at a 6 on a scale of 1-10 with 10 being great. Pt reports afternoon plans of spending time outside. Pt demonstrates some progress as evidenced by  participating in first group session. Patient denies SI/HI at the end of group.    Suicidal/Homicidal: Nowithout intent/plan  Plan: Pt will continue in PHP while working to decrease depression symptoms, increase emotional regulation, and increase ability to manage symptoms in a healthy manner.   Diagnosis: Severe episode of recurrent major depressive disorder, without psychotic features (HCC) [F33.2]    1. Severe episode of recurrent major depressive disorder, without psychotic features (HCC)       Donia Guiles, LCSW 02/17/2021

## 2021-02-17 NOTE — Patient Instructions (Addendum)
D:  Pt is discharged from MH-IOP.  A:  Follow up with current therapist Aram Beecham).  First appt with [facility] on 02-28-21.  Strongly encouraged support groups.  Return to work on 02-21-21 (without any restrictions) or as per PCP states.  R:  Patient receptive.

## 2021-02-17 NOTE — Psych (Signed)
Virtual Visit via Video Note  I connected with Maurice George on 01/12/21 at  9:00 AM EDT by a video enabled telemedicine application and verified that I am speaking with the correct person using two identifiers.  Location: Patient: patient home Provider: clinical home office   I discussed the limitations of evaluation and management by telemedicine and the availability of in person appointments. The patient expressed understanding and agreed to proceed.  I discussed the assessment and treatment plan with the patient. The patient was provided an opportunity to ask questions and all were answered. The patient agreed with the plan and demonstrated an understanding of the instructions.   The patient was advised to call back or seek an in-person evaluation if the symptoms worsen or if the condition fails to improve as anticipated.  Pt was provided 240 minutes of non-face-to-face time during this encounter.   Donia Guiles, LCSW    [facility] PHP THERAPIST PROGRESS NOTE  [name] [id number]  Session Time: 9:00 - 10:00  Participation Level: Active  Behavioral Response: CasualAlertDepressed  Type of Therapy: Group Therapy  Treatment Goals addressed: Coping  Interventions: CBT, DBT, Supportive, and Reframing  Summary: Clinician led check-in regarding current stressors and situation. Clinician utilized active listening and empathetic response and validated patient emotions. Clinician facilitated processing group on pertinent issues.   Therapist Response: Maurice George is a 35 y.o. male who presents with depression symptoms. Patient arrived within time allowed and reports that he is feeling "okay." Patient rates his mood at a 5 on a scale of 1-10 with 10 being great. Pt reports he napped yesterday due to low energy. Pt states he has struggled with irritability the past few days. Pt able to process. Pt engaged in discussion.         Session Time: 10:00 - 11:00   Participation  Level: Active   Behavioral Response: CasualAlertDepressed   Type of Therapy: Group Therapy   Treatment Goals addressed: Coping   Interventions: CBT, DBT, Supportive and Reframing   Summary: Cln introduced DBT emotion regulation skill, PLEASE. Cln discussed the importance of taking care of our whole body as a way to aid in stabilizing emotions. Group discussed ways they strugge to manage the elements of PLEASE and how to remove barriers.      Therapist Response: Pt engaged in discussion and is able to identify ways to utilize PLEASE skill.             Session Time: 11:00- 12:00   Participation Level: Active   Behavioral Response: CasualAlertDepressed   Type of Therapy: Group Therapy   Treatment Goals addressed: Coping   Interventions: Supportive, Reframing   Summary: Spiritual Care group   Therapist Response: Pt engaged in session. See chaplain note           Session Time: 12:00 -1:00   Participation Level: Active   Behavioral Response: CasualAlertDepressed   Type of Therapy: Group therapy   Treatment Goals addressed: Coping   Interventions: Psychosocial skills training, Supportive   Summary: 12:00 - 12:50: Occupational Therapy group 12:50 -1:00 Clinician led check-out. Clinician assessed for immediate needs, medication compliance and efficacy, and safety concerns   Therapist Response: 12:00 - 12:50: Patient engaged in group. See OT note. 12:50 - 1:00: At check-out, patient rates his mood at a 5 on a scale of 1-10 with 10 being great. Pt reports afternoon plans of running errands. Pt demonstrates some progress as evidenced by attempting skills. Patient denies SI/HI at the end of group.  Suicidal/Homicidal: Nowithout intent/plan  Plan: Pt will continue in PHP while working to decrease depression symptoms, increase emotional regulation, and increase ability to manage symptoms in a healthy manner.   Diagnosis: Severe episode of recurrent major depressive  disorder, without psychotic features (HCC) [F33.2]    1. Severe episode of recurrent major depressive disorder, without psychotic features (HCC)       Donia Guiles, LCSW 02/17/2021

## 2021-02-18 ENCOUNTER — Other Ambulatory Visit (HOSPITAL_COMMUNITY): Payer: 59

## 2021-02-18 ENCOUNTER — Other Ambulatory Visit: Payer: Self-pay

## 2021-02-18 ENCOUNTER — Encounter (HOSPITAL_COMMUNITY): Payer: Self-pay | Admitting: Family

## 2021-02-18 NOTE — Progress Notes (Signed)
Virtual Visit via Video Note  I connected with Maurice George on @TODAY @ at 12:00 PM EDT by a video enabled telemedicine application and verified that I am speaking with the correct person using two identifiers.  Location: Patient: at home Provider: at home office   I discussed the limitations of evaluation and management by telemedicine and the availability of in person appointments. The patient expressed understanding and agreed to proceed.  I discussed the assessment and treatment plan with the patient. The patient was provided an opportunity to ask questions and all were answered. The patient agreed with the plan and demonstrated an understanding of the instructions.   The patient was advised to call back or seek an in-person evaluation if the symptoms worsen or if the condition fails to improve as anticipated.  I provided 20 minutes of non-face-to-face time during this encounter.   , M.Ed,CNA   Patient ID: Maurice George, male   DOB: 05-18-1985, 35 y.o.   MRN: [id number] As per previous admit note states: "Maurice George is a 35 y.o. Caucasian male presents with depression and anxiety.  Reports he was followed by mental health services through [facility] however decided to discontinue his service and is seeking new healthcare providers.  He reports a history of panic attacks, posttraumatic stress disorder and depression.  I was diagnosed with antisocial personality disorder.  states he needs help with "emotional regulation".  States he is currently prescribed Prozac 40 mg and Neurontin 1600 mg daily.  He reports taking and tolerating medications well.  States frustration related to diagnosis of benzo dependency. 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  Reports that PHP was helpful.  Still c/o mood irritability, brain fog frustration, fear and anxiety.  On a scale of 1-10 (10 being the worst); pt rates his depression at a 1 and anxiety at a 2.  Denies SI/HI or A/V hallucinations.    Pt completed MH-IOP.  Only attended seven days out of the fifteen.  Every Thursday, pt had a standing appt with his therapist and there were other excused absences.  Pt reports feeling "amazing."  States that the groups were "amazing."  On a scale of 1-10 (10 being the worst); pt rates his depression at a 0 and anxiety at a 1.  Denies SI/HI or A/V hallucinations.  A:  D/C  today.  F/U with therapist Sunday) and medication will be managed by [facility] (first appt on 02-28-21).  Strongly recommended support groups; especially anger management.  R:  Pt receptive.03-02-21, M.Ed, CNA

## 2021-02-18 NOTE — Progress Notes (Addendum)
  [facility] Intensive Outpatient Program Discharge Summary  Maurice George [id number]  Admission date: 01/31/2021 Discharge date: 02/18/2021  Reason for admission:  per admission assessment note: Maurice George is a 35 y.o. Caucasian male presents with depression and anxiety.  Reports he was followed by mental health services through [facility] however decided to discontinue his service and is seeking new healthcare providers.  He reports a history of panic attacks, posttraumatic stress disorder and depression.  I was diagnosed with antisocial personality disorder.  states he needs help with "emotional regulation".  States he is currently prescribed Prozac 40 mg and Neurontin 1600 mg daily.  He reports taking and tolerating medications well.  States frustration related to diagnosis of benzo dependency. 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     Progress in Program Toward Treatment Goals: Maurice George attended and participated with daily group session with active and engaged participation.  He recently completed partial hospitalization programming and transition to intensive outpatient programming.  Patient was not available for follow-up on discharge date.  Patient to keep all outpatient follow-up appointments.  Progress (rationale):  Keep follow-up with Primary Care and Weekly therapy session appointments .   Take all medications as prescribed. Keep all follow-up appointments as scheduled.  Do not consume alcohol or use illegal drugs while on prescription medications. Report any adverse effects from your medications to your primary care provider promptly.  In the event of recurrent symptoms or worsening symptoms, call 911, a crisis hotline, or go to the nearest emergency  department for evaluation.    [name], NP 02/18/2021

## 2021-04-07 ENCOUNTER — Ambulatory Visit: Payer: 59 | Admitting: Orthopaedic Surgery

## 2021-05-03 ENCOUNTER — Emergency Department (HOSPITAL_COMMUNITY)
Admission: EM | Admit: 2021-05-03 | Discharge: 2021-05-03 | Discharge status: Against Medical Advice | Payer: 59 | Source: Home / Self Care

## 2021-05-31 ENCOUNTER — Ambulatory Visit: Payer: 59 | Admitting: Nurse Practitioner

## 2021-08-04 ENCOUNTER — Ambulatory Visit
Admission: EM | Admit: 2021-08-04 | Discharge: 2021-08-04 | Disposition: A | Discharge status: Home / Self Care | Payer: 59 | Attending: Internal Medicine | Admitting: Internal Medicine

## 2021-08-04 DIAGNOSIS — J069 Acute upper respiratory infection, unspecified: Secondary | ICD-10-CM | POA: Insufficient documentation

## 2021-08-04 DIAGNOSIS — J029 Acute pharyngitis, unspecified: Secondary | ICD-10-CM | POA: Insufficient documentation

## 2021-08-04 DIAGNOSIS — Z1152 Encounter for screening for COVID-19: Secondary | ICD-10-CM | POA: Diagnosis present

## 2021-08-04 DIAGNOSIS — A09 Infectious gastroenteritis and colitis, unspecified: Secondary | ICD-10-CM | POA: Insufficient documentation

## 2021-08-04 DIAGNOSIS — Z20822 Contact with and (suspected) exposure to covid-19: Secondary | ICD-10-CM | POA: Insufficient documentation

## 2021-08-04 LAB — POCT INFLUENZA A/B
Influenza A, POC: NEGATIVE
Influenza B, POC: NEGATIVE

## 2021-08-04 LAB — POCT RAPID STREP A (OFFICE): Rapid Strep A Screen: NEGATIVE

## 2021-08-04 MED ORDER — BENZONATATE 200 MG PO CAPS: 200.0000 mg | ORAL_CAPSULE | Freq: Two times a day (BID) | ORAL | 0 refills | Status: DC | PRN

## 2021-08-04 MED ORDER — HYDROCOD POLI-CHLORPHE POLI ER 10-8 MG/5ML PO SUER: 5.0000 mL | Freq: Every evening | ORAL | 0 refills | Status: AC | PRN

## 2021-08-04 MED ORDER — PENICILLIN V POTASSIUM 500 MG PO TABS: 500.0000 mg | ORAL_TABLET | Freq: Two times a day (BID) | ORAL | 0 refills | Status: AC

## 2021-08-04 NOTE — Discharge Instructions (Addendum)
We will inform you when the Covid and Strep test are back if positive.  ?

## 2021-08-04 NOTE — ED Provider Notes (Signed)
?[facility] ? ? ? ?CSN: [id number] ?Arrival date & time: 08/04/21  1036 ? ? ?  ? ?History   ?Chief Complaint ?Chief Complaint  ?Patient presents with  ? Sore Throat  ? Cough  ? Nasal Congestion  ? ? ?HPI ?Maurice George is a 36 y.o. male who presents with onset of ST, HA, nose congestion, rhinitis, cough x 1 days. Denies body aches. Has been more fatigued. Has had diarrhea x 2 days. He did not do a covid test at home. His appetite is down. Has had mild nausea.  ? ? ? ?Past Medical History:  ?Diagnosis Date  ? Abnormal liver enzymes   ? Anal fissure   ? Anxiety   ? Asthma   ? Backache, unspecified   ? Depression   ? Diabetes mellitus (HCC)   ? H/O hemorrhoids   ? History of chicken pox   ? Hyperlipidemia   ? Hypertension   ? Obesity   ? Other abnormal glucose   ? Sleep apnea   ? ? ?Patient Active Problem List  ? Diagnosis Date Noted  ? 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 Ganglion cyst of wrist, left 02/03/2021  ? Major depressive disorder, recurrent episode, severe (HCC) 01/17/2021  ? MDD (major depressive disorder), recurrent, in full remission (HCC) 08/11/2020  ? RLS (restless legs syndrome) 07/30/2020  ? Cannabis use disorder, severe, in early remission (HCC) 07/07/2020  ? Internal and external bleeding hemorrhoids 04/01/2020  ? 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 Rectal bleeding 12/12/2019  ? Rectal pain 12/12/2019  ? Anal fissure 12/12/2019  ? Uncontrolled type 2 diabetes mellitus with hyperglycemia (HCC) 01/01/2019  ? Essential hypertension 07/05/2018  ? GAD (generalized anxiety disorder) 03/21/2018  ? Palpitations 02/27/2017  ? Morbid obesity with BMI of 60.0-69.9, adult (HCC) 04/14/2016  ? Tobacco use disorder 04/14/2016  ? Depression 07/03/2014  ? Prediabetes 02/10/2014  ? Psychological factors affecting morbid obesity (HCC) 02/04/2014  ? Severe cannabis use disorder (HCC) 10/16/2013  ? Atrial tachycardia (HCC) 05/29/2013  ?  Chest pain 05/29/2013  ? Morbid obesity (HCC) 05/29/2013  ? Hyperlipidemia 05/29/2013  ? OSA (obstructive sleep apnea) 05/29/2013  ? ? ?Past Surgical History:  ?Procedure Laterality Date  ? TONSILLECTOMY  1999  ? ? ? ? ? ?Home Medications   ? ?Prior to Admission medications   ?Medication Sig Start Date End Date Taking? Authorizing Provider  ?chlorpheniramine-HYDROcodone (TUSSIONEX PENNKINETIC ER) 10-8 MG/5ML Take 5 mLs by mouth at bedtime as needed for cough. 08/04/21  Yes Rodriguez-Southworth, Nettie ElmSylvia, PA-C  ?penicillin v potassium (VEETID) 500 MG tablet Take 1 tablet (500 mg total) by mouth in the morning and at bedtime for 10 days. 08/04/21 08/14/21 Yes Rodriguez-Southworth, Nettie ElmSylvia, PA-C  ?atorvastatin (LIPITOR) 40 MG tablet Take 40 mg by mouth daily.    [provider]  ?cariprazine (VRAYLAR) 1.5 MG capsule Vraylar 1.5 mg capsule    [provider]  ?FLUoxetine (PROZAC) 40 MG capsule Take 1 capsule (40 mg total) by mouth daily. 02/08/21   Oneta RackLewis, Tanika N, NP  ?gabapentin (NEURONTIN) 800 MG tablet Take 1 tablet (800 mg total) by mouth 4 (four) times daily. 02/08/21   Oneta RackLewis, Tanika N, NP  ?hydrochlorothiazide (HYDRODIURIL) 25 MG tablet Take 25 mg by mouth daily. 12/06/20   [provider]  ?lisinopril (ZESTRIL) 40 MG tablet Take 40 mg by mouth daily.    [provider]  ?loratadine (CLARITIN) 10 MG tablet Take 10  mg by mouth daily.    [provider]  ?metFORMIN (GLUCOPHAGE) 1000 MG tablet Take 1,000 mg by mouth 2 (two) times daily with a meal.    [provider]  ?traZODone (DESYREL) 50 MG tablet Take 1 tablet (50 mg total) by mouth at bedtime as needed and may repeat dose one time if needed for sleep. 02/08/21   [name], NP  ? ? ?Family History ?Family History  ?Problem Relation Age of Onset  ? Clotting disorder Father   ? Depression Maternal Grandfather   ? Hypertension Maternal Grandfather   ? Lung cancer Maternal Grandfather   ? Diabetes Maternal  Grandmother   ? Hypertension Paternal Grandfather   ? Heart disease Paternal Grandfather   ? Heart attack Paternal Grandfather   ? ? ?Social History ?Social History  ? ?Tobacco Use  ? Smoking status: Every Day  ?  Packs/day: 0.50  ?  Types: Cigarettes  ? Smokeless tobacco: Never  ?Vaping Use  ? Vaping Use: Never used  ?Substance Use Topics  ? Alcohol use: Not Currently  ? Drug use: Yes  ?  Types: Marijuana  ? ? ? ?Allergies   ?Prednisone and Tamiflu [oseltamivir] ? ? ?Review of Systems ?Review of Systems  ?Constitutional:  Positive for appetite change and fatigue. Negative for fever.  ?HENT:  Positive for congestion, postnasal drip, rhinorrhea and sore throat. Negative for ear discharge and ear pain.   ?Eyes:  Negative for discharge.  ?Respiratory:  Positive for cough.   ?Gastrointestinal:  Positive for diarrhea and nausea.  ?Musculoskeletal:  Negative for myalgias.  ?Skin:  Negative for rash.  ?Neurological:  Positive for headaches.  ? ? ?Physical Exam ?Triage Vital Signs ?ED Triage Vitals  ?Enc Vitals Group  ?   BP 08/04/21 1059 137/85  ?   Pulse Rate 08/04/21 1059 69  ?   Resp 08/04/21 1059 18  ?   Temp 08/04/21 1059 98 ?F (36.7 ?C)  ?   Temp Source 08/04/21 1059 Oral  ?   SpO2 08/04/21 1059 97 %  ?   Weight --   ?   Height --   ?   Head Circumference --   ?   Peak Flow --   ?   Pain Score 08/04/21 1108 4  ?   Pain Loc --   ?   Pain Edu? --   ?   Excl. in GC? --   ? ?No data found. ? ?Updated Vital Signs ?BP 137/85 (BP Location: Left Wrist)   Pulse 69   Temp 98 ?F (36.7 ?C) (Oral)   Resp 18   SpO2 97%  ? ?Visual Acuity ?Right Eye Distance:   ?Left Eye Distance:   ?Bilateral Distance:   ? ?Right Eye Near:   ?Left Eye Near:    ?Bilateral Near:    ? ? ?Physical Exam ?Vitals signs and nursing note reviewed.  ?Constitutional:   ?   General: She is not in acute distress. ?   Appearance: Normal appearance. She is not ill-appearing, toxic-appearing or diaphoretic.  ?HENT:  ?   Head: Normocephalic.  ?   Right Ear:  Tympanic membrane, ear canal and external ear normal.  ?   Left Ear: Tympanic membrane, ear canal and external ear normal.  ?   Nose: Nose normal.  ?   Mouth/Throat: erythematous ?   Mouth: Mucous membranes are moist.  ?Eyes:  ?   General: No scleral icterus.    ?   Right  eye: No discharge.     ?   Left eye: No discharge.  ?   Conjunctiva/sclera: Conjunctivae normal.  ?Neck:  ?   Musculoskeletal: Neck supple. No neck rigidity.  ?Cardiovascular:  ?   Rate and Rhythm: Normal rate and regular rhythm.  ?   Heart sounds: No murmur.  ?Pulmonary:  ?   Effort: Pulmonary effort is normal.  ?   Breath sounds: Normal breath sounds.  ?  ?Musculoskeletal: Normal range of motion.  ?Lymphadenopathy:  ?   Cervical: No cervical adenopathy.  ?Skin: ?   General: Skin is warm and dry.  ?   Coloration: Skin is not jaundiced.  ?   Findings: No rash.  ?Neurological:  ?   Mental Status: She is alert and oriented to person, place, and time.  ?   Gait: Gait normal.  ?Psychiatric:     ?   Mood and Affect: Mood normal.     ?   Behavior: Behavior normal.     ?   Thought Content: Thought content normal.     ?   Judgment: Judgment normal.  ? ? ?UC Treatments / Results  ?Labs ?(all labs ordered are listed, but only abnormal results are displayed) ?Labs Reviewed  ?COVID-19, FLU A+B NAA  ?CULTURE, GROUP A STREP [facility])  ?NOVEL CORONAVIRUS, NAA  ?POCT RAPID STREP A (OFFICE)  ?POCT INFLUENZA A/B  ?Rapid strep neg, rapid flu is negative ? ?EKG ? ? ?Radiology ?No results found. ? ?Procedures ?Procedures (including critical care time) ? ?Medications Ordered in UC ?Medications - No data to display ? ?Initial Impression / Assessment and Plan / UC Course  ?I have reviewed the triage vital signs and the nursing notes. ? ?Pertinent labs  results that were available during my care of the patient were reviewed by me and considered in my medical decision making (see chart for details). ? ? ?Covid test is pending ? ?Has URI and Pharyngitis ? ?I placed him on [facility]  and [facility] as noted.  ? ?We will call him if the tests are positive ? ?Final Clinical Impressions(s) / UC Diagnoses  ? ?Final diagnoses:  ?Encounter for screening for COVID-19  ?Acute upper respiratory infection  ?

## 2021-08-04 NOTE — ED Triage Notes (Signed)
Patient presents to Urgent Care with complaints of sore throat, nasal congestion, cough, headache, x 1 day. Diarrhea x 2 days. Treating symptoms with Tylenol.  ?

## 2021-08-05 LAB — NOVEL CORONAVIRUS, NAA: SARS-CoV-2, NAA: NOT DETECTED

## 2021-08-06 LAB — CULTURE, GROUP A STREP (THRC)

## 2021-12-05 ENCOUNTER — Telehealth (HOSPITAL_COMMUNITY): Payer: Self-pay | Admitting: Licensed Clinical Social Worker

## 2021-12-07 ENCOUNTER — Telehealth (HOSPITAL_COMMUNITY): Payer: Self-pay | Admitting: Professional

## 2021-12-07 ENCOUNTER — Other Ambulatory Visit (HOSPITAL_COMMUNITY): Payer: 59

## 2021-12-08 ENCOUNTER — Telehealth (HOSPITAL_COMMUNITY): Payer: Self-pay | Admitting: Licensed Clinical Social Worker

## 2021-12-08 NOTE — Telephone Encounter (Signed)
Cln called pt to reschedule PHP. Pt asked about delaying Okey Dupre in case he does not meet criteria for [facility]. Cln said he most likely will not be declined by [facility] but cannot guarantee he meets criteria until CCA is completed, and cln is unaware of [facility]'s policies. Pt agreed to CCA on 8/14 at 12pm. Denied SI

## 2021-12-09 ENCOUNTER — Telehealth (HOSPITAL_COMMUNITY): Payer: Self-pay | Admitting: Professional

## 2021-12-12 ENCOUNTER — Other Ambulatory Visit (HOSPITAL_COMMUNITY): Payer: 59 | Attending: Psychiatry | Admitting: Professional

## 2021-12-12 DIAGNOSIS — F332 Major depressive disorder, recurrent severe without psychotic features: Secondary | ICD-10-CM

## 2021-12-12 DIAGNOSIS — F411 Generalized anxiety disorder: Secondary | ICD-10-CM

## 2021-12-13 ENCOUNTER — Ambulatory Visit (HOSPITAL_COMMUNITY): Payer: 59

## 2021-12-13 ENCOUNTER — Other Ambulatory Visit (HOSPITAL_COMMUNITY): Payer: 59
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
## 2021-12-14 ENCOUNTER — Other Ambulatory Visit (HOSPITAL_COMMUNITY): Payer: 59

## 2021-12-14 ENCOUNTER — Ambulatory Visit (HOSPITAL_COMMUNITY): Payer: 59

## 2021-12-15 ENCOUNTER — Other Ambulatory Visit (HOSPITAL_COMMUNITY): Payer: 59 | Attending: Psychiatry | Admitting: Licensed Clinical Social Worker

## 2021-12-15 ENCOUNTER — Encounter (HOSPITAL_COMMUNITY): Payer: Self-pay

## 2021-12-15 ENCOUNTER — Other Ambulatory Visit (HOSPITAL_COMMUNITY): Payer: 59 | Attending: Psychiatry

## 2021-12-15 DIAGNOSIS — F4310 Post-traumatic stress disorder, unspecified: Secondary | ICD-10-CM

## 2021-12-15 DIAGNOSIS — Z87891 Personal history of nicotine dependence: Secondary | ICD-10-CM | POA: Diagnosis not present

## 2021-12-15 DIAGNOSIS — Z79899 Other long term (current) drug therapy: Secondary | ICD-10-CM | POA: Insufficient documentation

## 2021-12-15 DIAGNOSIS — F411 Generalized anxiety disorder: Secondary | ICD-10-CM | POA: Insufficient documentation

## 2021-12-15 DIAGNOSIS — Z8659 Personal history of other mental and behavioral disorders: Secondary | ICD-10-CM | POA: Diagnosis not present

## 2021-12-15 DIAGNOSIS — R4589 Other symptoms and signs involving emotional state: Secondary | ICD-10-CM | POA: Diagnosis present

## 2021-12-15 DIAGNOSIS — E119 Type 2 diabetes mellitus without complications: Secondary | ICD-10-CM | POA: Diagnosis not present

## 2021-12-15 DIAGNOSIS — F329 Major depressive disorder, single episode, unspecified: Secondary | ICD-10-CM | POA: Diagnosis not present

## 2021-12-15 DIAGNOSIS — F332 Major depressive disorder, recurrent severe without psychotic features: Secondary | ICD-10-CM | POA: Diagnosis not present

## 2021-12-15 DIAGNOSIS — F3162 Bipolar disorder, current episode mixed, moderate: Secondary | ICD-10-CM | POA: Diagnosis not present

## 2021-12-15 DIAGNOSIS — F1211 Cannabis abuse, in remission: Secondary | ICD-10-CM | POA: Diagnosis not present

## 2021-12-15 MED ORDER — HYDROXYZINE PAMOATE 25 MG PO CAPS: 25.0000 mg | ORAL_CAPSULE | Freq: Three times a day (TID) | ORAL | 0 refills | Status: AC | PRN
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
## 2021-12-16 ENCOUNTER — Ambulatory Visit (HOSPITAL_COMMUNITY): Payer: 59

## 2021-12-16 ENCOUNTER — Other Ambulatory Visit (HOSPITAL_COMMUNITY): Payer: 59

## 2021-12-19 ENCOUNTER — Encounter (HOSPITAL_COMMUNITY): Payer: Self-pay

## 2021-12-19 ENCOUNTER — Other Ambulatory Visit (HOSPITAL_COMMUNITY): Payer: 59 | Attending: Psychiatry

## 2021-12-19 ENCOUNTER — Other Ambulatory Visit (HOSPITAL_COMMUNITY): Payer: 59 | Attending: Psychiatry | Admitting: Licensed Clinical Social Worker

## 2021-12-19 DIAGNOSIS — F1221 Cannabis dependence, in remission: Secondary | ICD-10-CM | POA: Diagnosis not present

## 2021-12-19 DIAGNOSIS — R45851 Suicidal ideations: Secondary | ICD-10-CM | POA: Diagnosis present

## 2021-12-19 DIAGNOSIS — F332 Major depressive disorder, recurrent severe without psychotic features: Secondary | ICD-10-CM | POA: Diagnosis present

## 2021-12-19 DIAGNOSIS — F3342 Major depressive disorder, recurrent, in full remission: Secondary | ICD-10-CM | POA: Insufficient documentation

## 2021-12-19 DIAGNOSIS — G2581 Restless legs syndrome: Secondary | ICD-10-CM | POA: Diagnosis not present

## 2021-12-19 DIAGNOSIS — F411 Generalized anxiety disorder: Secondary | ICD-10-CM | POA: Insufficient documentation

## 2021-12-19 DIAGNOSIS — M67432 Ganglion, left wrist: Secondary | ICD-10-CM | POA: Diagnosis not present

## 2021-12-19 DIAGNOSIS — R4589 Other symptoms and signs involving emotional state: Secondary | ICD-10-CM
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
## 2021-12-19 NOTE — Therapy (Signed)
[facility] PARTIAL HOSPITALIZATION PROGRAM [address] [place], [place], [address] Phone: [phone]   Fax:  [phone]  Occupational Therapy Treatment Virtual Visit via Video Note  I connected with Maurice George on 12/19/21 at  8:00 AM EDT by a video enabled telemedicine application and verified that I am speaking with the correct person using two identifiers.  Location: Patient: home Provider: office   I discussed the limitations of evaluation and management by telemedicine and the availability of in person appointments. The patient expressed understanding and agreed to proceed.    The patient was advised to call back or seek an in-person evaluation if the symptoms worsen or if the condition fails to improve as anticipated.  I provided 55 minutes of non-face-to-face time during this encounter.   Patient Details  Name: Maurice George MRN: [id number] Date of Birth: 02-29-1986 Referring Provider (OT): Maurice George   Encounter Date: 12/19/2021   OT End of Session - 12/19/21 1614     Visit Number 2    Number of Visits 20    Date for OT Re-Evaluation 01/14/22    OT Start Time 1200    OT Stop Time 1255    OT Time Calculation (min) 55 min             Past Medical History:  Diagnosis Date   Abnormal liver enzymes    Anal fissure    Anxiety    Asthma    Backache, unspecified    Depression    Diabetes mellitus (HCC)    H/O hemorrhoids    History of chicken pox    Hyperlipidemia    Hypertension    Obesity    Other abnormal glucose    Sleep apnea     Past Surgical History:  Procedure Laterality Date   TONSILLECTOMY  1999    There were no vitals filed for this visit.   Subjective Assessment - 12/19/21 1613     Currently in Pain? No/denies    Pain Score 0-No pain                Group Session:  S: Doing well. Had a good weekend.  O: This presentation aims to explore the intricate relationship between chronic pain,  depression, and the resultant impact on Activities of Daily Living (ADLs) and Instrumental Activities of Daily Living (iADLs). Through a multidisciplinary lens, it delves into common causes and symptoms, offering occupational therapy-aligned strategies for symptom improvement without medication. Among these strategies are targeted core and lower extremity strengthening exercises to mitigate low back pain due to anterior pelvic tilt, as well as granular communication techniques for patients to effectively express their chronic pain symptoms to healthcare providers. The emphasis is on non-pharmacological, adaptive approaches to improve mental health and functional performance, providing both healthcare providers and patients a well-rounded understanding of managing chronic pain and depression.  A: The patient was highly engaged throughout the presentation, demonstrating keen interest by actively participating in discussions and asking insightful questions regarding pain measurement scales and adaptive strategies for daily living. This level of engagement suggests a strong willingness to implement the discussed techniques in their own life, making them a good candidate for a tailored occupational therapy intervention plan aimed at addressing their specific challenges related to chronic pain and depression.  P: Continue to attend PHP OT group sessions 5x week for 4 weeks to promote daily structure, social engagement, and opportunities to develop and utilize adaptive strategies to maximize functional performance in  preparation for safe transition and integration back into school, work, and the community. Plan to address topic of Pain / Depression part 2 in next OT group session.                   OT Education - 12/19/21 1613     Education Details The Interconnected Web of Chronic Pain, Depression, and Daily Living              OT Short Term Goals - 12/15/21 1638       OT SHORT TERM GOAL  #1   Title Pt will actively engage in OT group sessions throughout duration of PHP programming, in order to promote daily structure, social engagement, and opportunities to develop and utilize adaptive strategies to maximize functional performance in preparation for safe transition and integration back into school, work, and the community.    Time 4    Period Weeks    Status New    Target Date 01/14/22      OT SHORT TERM GOAL #2   Title Pt will demonstrate improved ability to communicate feelings/needs/wants, without being angry/irritable/aggressive, as evidenced by, active participation in OT sessions, throughout duration of PHP programming, in order to safely transition back into the community at discharge.    Time 4    Period Weeks    Status New    Target Date 01/14/22      OT SHORT TERM GOAL #3   Title Pt will identify 1-3 stress management strategies he can utilize, in order to safely manage increased psychosocial stressors identified, with min cues, in preparation for safe transition back to the community at discharge.    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, 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   Ganglion cyst of wrist, left 02/03/2021   Major depressive disorder, recurrent episode, severe (HCC) 01/17/2021   MDD (major depressive disorder), recurrent, in full remission (HCC) 08/11/2020   RLS (restless legs syndrome)  07/30/2020   Cannabis use disorder, severe, in early remission (HCC) 07/07/2020   Internal and external bleeding hemorrhoids 04/01/2020   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) 12/24/2019   Rectal bleeding 12/12/2019   Rectal pain 12/12/2019   Anal fissure 12/12/2019   Uncontrolled type 2 diabetes mellitus with hyperglycemia (HCC) 01/01/2019   Essential hypertension 07/05/2018   GAD (generalized anxiety disorder) 03/21/2018   Palpitations 02/27/2017   Morbid obesity with BMI of 60.0-69.9, adult (HCC) 04/14/2016   Tobacco use disorder 04/14/2016   Depression 07/03/2014   Prediabetes 02/10/2014   Psychological factors affecting morbid obesity (HCC) 02/04/2014   Severe cannabis use disorder (HCC) 10/16/2013   Atrial tachycardia (HCC) 05/29/2013   Chest pain 05/29/2013   Morbid obesity (HCC) 05/29/2013   Hyperlipidemia 05/29/2013   OSA (obstructive sleep apnea) 05/29/2013  Ted Mcalpine, OT 12/19/2021, 4:14 PM  Kerrin Champagne, OT   [facility] PROGRAM [address] [place], [place], [address] Phone: [phone]   Fax:  [phone]  Name: Takoda Janowiak MRN: [id number] Date of Birth: 06-04-1985

## 2021-12-20 ENCOUNTER — Other Ambulatory Visit (HOSPITAL_COMMUNITY): Payer: 59 | Attending: Psychiatry | Admitting: Licensed Clinical Social Worker

## 2021-12-20 ENCOUNTER — Encounter (HOSPITAL_COMMUNITY): Payer: Self-pay | Admitting: Family

## 2021-12-20 ENCOUNTER — Other Ambulatory Visit (HOSPITAL_COMMUNITY): Payer: 59 | Attending: Psychiatry

## 2021-12-20 DIAGNOSIS — R4589 Other symptoms and signs involving emotional state: Secondary | ICD-10-CM

## 2021-12-20 DIAGNOSIS — F3162 Bipolar disorder, current episode mixed, moderate: Secondary | ICD-10-CM

## 2021-12-20 DIAGNOSIS — F4310 Post-traumatic stress disorder, unspecified: Secondary | ICD-10-CM

## 2021-12-20 NOTE — Progress Notes (Signed)
Virtual Visit via Video Note  I connected with Maurice George on 12/20/21 at  9:00 AM EDT by a video enabled telemedicine application and verified that I am speaking with the correct person using two identifiers.  Location: Patient: Home Provider: Office   I discussed the limitations of evaluation and management by telemedicine and the availability of in person appointments. The patient expressed understanding and agreed to proceed.    I discussed the assessment and treatment plan with the patient. The patient was provided an opportunity to ask questions and all were answered. The patient agreed with the plan and demonstrated an understanding of the instructions.   The patient was advised to call back or seek an in-person evaluation if the symptoms worsen or if the condition fails to improve as anticipated.  I provided 15 minutes of non-face-to-face time during this encounter.   [name], NP  BH MD/PA/NP OP Progress Note  Date: 12/20/2021 Name: Maurice George MRN: [id number]   Chief Complaint: Worsening depression with passive suicidal ideations.  Subjective: "I just had questions related to my medications now that the bipolar diagnosis is back on my chart."  Evaluation: Maurice George was seen and evaluated via Web-ex assessment.  He is awake, alert and oriented x3.  Denying suicidal or homicidal ideations.  Denies auditory visual hallucinations.  He reports multiple stressors related to readmission to partial hospitalization programming.  He reports elevated mood that waxes and wanes.  States recently had a new baby which is causing strain on their (he and his wife) relationship.  He reports he is followed by [facility] psychiatrist Johny Blamer where he is prescribed Vraylar 3 mg, Trileptal 600 mg p.o. 3 times daily and fluoxetine 60 mg.  He reports taking and tolerating medications well.  He reports his primary provider recently initiated Vraylar which she  feels like he is doing okay.  He reports he would like to give it a few more weeks in order to decide if he wanted to do a medication adjustment.  He reports a good appetite.  States is resting well throughout the night.  Support, encouragement and reassurance was provided.   Patient is a 36 y.o. Caucasian male presents with suicidal ideations and depression.  Patient was enrolled in partial psychiatric program on 12/20/21.  Primary complaints include: anxiety and difficulty sleeping.  Onset of symptoms was gradual with gradually worsening course since that time. Psychosocial Stressors include the following: family and financial.   I have reviewed the following documentation dated past psychiatric history and past medical history  Complaints of Pain: nonear Past Psychiatric History:  Past psychiatric hospitalizations  and Previous suicide attempts   Currently in treatment with Prozac, Trileptal and Vraylar   HPI:  Visit Diagnosis:    ICD-10-CM   1. Bipolar disorder, current episode mixed, moderate (HCC)  F31.62 OT eval and treat    Admit to Partial Hospitalization Program    2. PTSD (post-traumatic stress disorder)  F43.10 Admit to Partial Hospitalization Program    3. Difficulty coping  R45.89       Past Psychiatric History:   Past Medical History:  Past Medical History:  Diagnosis Date   Abnormal liver enzymes    Anal fissure    Anxiety    Asthma    Backache, unspecified    Depression    Diabetes mellitus (HCC)    H/O hemorrhoids    History of chicken pox    Hyperlipidemia    Hypertension  Obesity    Other abnormal glucose    Sleep apnea     Past Surgical History:  Procedure Laterality Date   TONSILLECTOMY  1999    Family Psychiatric History:   Family History:  Family History  Problem Relation Age of Onset   Clotting disorder Father    Drug abuse Maternal Grandfather    Depression Maternal Grandfather    Hypertension Maternal Grandfather    Lung  cancer Maternal Grandfather    Diabetes Maternal Grandmother    Hypertension Paternal Grandfather    Heart disease Paternal Grandfather    Heart attack Paternal Grandfather     Social History:  Social History   Socioeconomic History   Marital status: Married    Spouse name: Not on file   Number of children: 1   Years of education: 16   Highest education level: Bachelor's degree (e.g., BA, AB, BS)  Occupational History   Occupation: [employer]  Tobacco Use   Smoking status: Every Day    Packs/day: 0.50    Types: Cigarettes   Smokeless tobacco: Never  Vaping Use   Vaping Use: Never used  Substance and Sexual Activity   Alcohol use: Not Currently   Drug use: Yes    Types: Marijuana   Sexual activity: Yes    Birth control/protection: None  Other Topics Concern   Not on file  Social History Narrative   Regular exercise-no   Caffeine Use-yes   Social Determinants of Health   Financial Resource Strain: Not on file  Food Insecurity: Not on file  Transportation Needs: Not on file  Physical Activity: Not on file  Stress: Not on file  Social Connections: Not on file    Allergies:  Allergies  Allergen Reactions   Prednisone Other (See Comments) and Palpitations   Tamiflu [Oseltamivir] Palpitations    Metabolic Disorder Labs: Lab Results  Component Value Date   HGBA1C 5.8 04/23/2012   No results found for: "PROLACTIN" Lab Results  Component Value Date   CHOL 235 (H) 04/23/2012   TRIG 122.0 04/23/2012   HDL 32.90 (L) 04/23/2012   CHOLHDL 7 04/23/2012   VLDL 24.4 04/23/2012   No results found for: "TSH"  Therapeutic Level Labs: No results found for: "LITHIUM" No results found for: "VALPROATE" No results found for: "CBMZ"  Current Medications: Current Outpatient Medications  Medication Sig Dispense Refill   atorvastatin (LIPITOR) 40 MG tablet Take 40 mg by mouth daily.     cariprazine (VRAYLAR) 1.5 MG capsule Vraylar 1.5 mg capsule      chlorpheniramine-HYDROcodone (TUSSIONEX PENNKINETIC ER) 10-8 MG/5ML Take 5 mLs by mouth at bedtime as needed for cough. (Patient not taking: Reported on 12/19/2021) 75 mL 0   diclofenac (VOLTAREN) 75 MG EC tablet Take 75 mg by mouth 2 (two) times daily.     fexofenadine (ALLEGRA) 180 MG tablet Take 180 mg by mouth daily.     FLUoxetine (PROZAC) 40 MG capsule Take 1 capsule (40 mg total) by mouth daily. 60 capsule 0   gabapentin (NEURONTIN) 800 MG tablet Take 1 tablet (800 mg total) by mouth 4 (four) times daily. 240 tablet 0   hydrochlorothiazide (HYDRODIURIL) 25 MG tablet Take 25 mg by mouth daily. (Patient not taking: Reported on 12/19/2021)     hydrOXYzine (VISTARIL) 25 MG capsule Take 1 capsule (25 mg total) by mouth 3 (three) times daily as needed for anxiety. 90 capsule 0   lisinopril (ZESTRIL) 40 MG tablet Take 40 mg by mouth daily. (Patient  not taking: Reported on 12/19/2021)     lisinopril-hydrochlorothiazide (ZESTORETIC) 20-12.5 MG tablet Take 1 tablet by mouth daily.     loratadine (CLARITIN) 10 MG tablet Take 10 mg by mouth daily. (Patient not taking: Reported on 12/19/2021)     metFORMIN (GLUCOPHAGE) 1000 MG tablet Take 1,000 mg by mouth 2 (two) times daily with a meal.     oxcarbazepine (TRILEPTAL) 600 MG tablet Take 600 mg by mouth 2 (two) times daily.     traZODone (DESYREL) 50 MG tablet Take 1 tablet (50 mg total) by mouth at bedtime as needed and may repeat dose one time if needed for sleep. 30 tablet 0   varenicline (CHANTIX PAK) 0.5 MG X 11 & 1 MG X 42 tablet Take 1 mg by mouth taper from 4 doses each day to 1 dose and stop.     No current facility-administered medications for this visit.     Musculoskeletal: Strength & Muscle Tone: within normal limits Gait & Station: normal Patient leans: N/A  Psychiatric Specialty Exam: Review of Systems  There were no vitals taken for this visit.There is no height or weight on file to calculate BMI.  General Appearance: Casual  Eye  Contact:  Good  Speech:  Clear and Coherent  Volume:  Normal  Mood:  Anxious  Affect:  Congruent  Thought Process:  Coherent  Orientation:  Full (Time, Place, and Person)  Thought Content: Logical   Suicidal Thoughts:  No  Homicidal Thoughts:  No  Memory:  Recent;   Good Remote;   Good  Judgement:  Good  Insight:  Good  Psychomotor Activity:  Normal  Concentration:  Concentration: Good  Recall:  Good  Fund of Knowledge: Fair  Language: Good  Akathisia:  No  Handed:  Right  AIMS (if indicated): done  Assets:  Communication Skills Desire for Improvement  ADL's:  Intact  Cognition: WNL  Sleep:  Good   Screenings: PHQ2-9    Flowsheet Row Counselor from 12/19/2021 in [facility]AL [facility] PARTIAL HOSPITALIZATION PROGRAM Counselor from 12/12/2021 in BEHAVIORAL HEALTH PARTIAL HOSPITALIZATION PROGRAM Counselor from 01/31/2021 in BEHAVIORAL HEALTH INTENSIVE PSYCH Counselor from 01/26/2021 in BEHAVIORAL HEALTH PARTIAL HOSPITALIZATION PROGRAM Counselor from 01/18/2021 in BEHAVIORAL HEALTH PARTIAL HOSPITALIZATION PROGRAM  PHQ-2 Total Score 6 6 2 2 6   PHQ-9 Total Score 25 27 9 14 26       Flowsheet Row Counselor from 12/19/2021 in BEHAVIORAL HEALTH PARTIAL HOSPITALIZATION PROGRAM Counselor from 12/12/2021 in BEHAVIORAL HEALTH PARTIAL HOSPITALIZATION PROGRAM ED from 08/04/2021 in [facility]   C-SSRS RISK CATEGORY Error: Question 6 not populated No Risk No Risk        Assessment and Plan:  Continue partial hospitalization programming Continue medications as indicated by primary care provider   Collaboration of Care: Collaboration of Care: Medication Management AEB continue Vraylar, Trileptal and Prozac.  Patient/Guardian was advised Release of Information must be obtained prior to any record release in order to collaborate their care with an outside provider. Patient/Guardian was advised if they have not already done so to contact the registration department to sign  all necessary forms in order for [facility] to release information regarding their care.   Consent: Patient/Guardian gives verbal consent for treatment and assignment of benefits for services provided during this visit. Patient/Guardian expressed understanding and agreed to proceed.    [facility], NP 12/20/2021, 12:22 PM

## 2021-12-20 NOTE — Progress Notes (Deleted)
Virtual Visit via Video Note  I connected with Maurice George on 12/20/21 at  9:00 AM EDT by a video enabled telemedicine application and verified that I am speaking with the correct person using two identifiers.  Location: Patient: Home Provider: Office   I discussed the limitations of evaluation and management by telemedicine and the availability of in person appointments. The patient expressed understanding and agreed to proceed.    I discussed the assessment and treatment plan with the patient. The patient was provided an opportunity to ask questions and all were answered. The patient agreed with the plan and demonstrated an understanding of the instructions.   The patient was advised to call back or seek an in-person evaluation if the symptoms worsen or if the condition fails to improve as anticipated.  I provided 15 minutes of non-face-to-face time during this encounter.   Maurice Rack, NP    Behavioral Health Partial Program Assessment Note  Date: 12/20/2021 Name: Maurice George MRN: [id number]   Chief Complaint: Worsening depression with passive suicidal ideations.  Subjective: "I just had questions related to my medications now that the bipolar diagnosis is back on my chart."  Evaluation: Maurice George was seen and evaluated via telemetry assessment.  He is awake, alert and oriented x3.  Denying suicidal or homicidal ideations.  Denies auditory visual hallucinations.  He reports multiple stressors related to readmission to partial hospitalization programming.  He reports elevated mood that waxes and wanes.  States recently had a new baby which is causing strain on their (he and his wife) relationship.  He reports he is followed by [facility] psychiatrist Johny Blamer where he is prescribed Vraylar 3 mg, Trileptal 600 mg p.o. 3 times daily and fluoxetine 60 mg.  He reports taking and tolerating medications well.  He reports his primary provider recently  initiated Vraylar which she feels like he is doing okay.  He reports he would like to give it a few more weeks in order to decide if he wanted to do a medication adjustment.  He reports a good appetite.  States is resting well throughout the night.  Support, encouragement and reassurance was provided.   Patient is a 36 y.o. Caucasian male presents with suicidal ideations and depression.  Patient was enrolled in partial psychiatric program on 12/20/21.  Primary complaints include: anxiety and difficulty sleeping.  Onset of symptoms was gradual with gradually worsening course since that time. Psychosocial Stressors include the following: family and financial.   I have reviewed the following documentation dated past psychiatric history and past medical history  Complaints of Pain: nonear Past Psychiatric History:  Past psychiatric hospitalizations  and Previous suicide attempts   Currently in treatment with Prozac, Trileptal and Vraylar  Substance Abuse History: none Use of Alcohol: denied Use of Caffeine: denies use Use of over the counter:   Past Surgical History:  Procedure Laterality Date   TONSILLECTOMY  1999    Past Medical History:  Diagnosis Date   Abnormal liver enzymes    Anal fissure    Anxiety    Asthma    Backache, unspecified    Depression    Diabetes mellitus (HCC)    H/O hemorrhoids    History of chicken pox    Hyperlipidemia    Hypertension    Obesity    Other abnormal glucose    Sleep apnea    Outpatient Encounter Medications as of 12/20/2021  Medication Sig Note   atorvastatin (LIPITOR) 40 MG tablet Take  40 mg by mouth daily.    cariprazine (VRAYLAR) 1.5 MG capsule Vraylar 1.5 mg capsule 12/19/2021: Taking 3mg    chlorpheniramine-HYDROcodone (TUSSIONEX PENNKINETIC ER) 10-8 MG/5ML Take 5 mLs by mouth at bedtime as needed for cough. (Patient not taking: Reported on 12/19/2021)    diclofenac (VOLTAREN) 75 MG EC tablet Take 75 mg by mouth 2 (two) times daily.     fexofenadine (ALLEGRA) 180 MG tablet Take 180 mg by mouth daily.    FLUoxetine (PROZAC) 40 MG capsule Take 1 capsule (40 mg total) by mouth daily.    gabapentin (NEURONTIN) 800 MG tablet Take 1 tablet (800 mg total) by mouth 4 (four) times daily. 12/19/2021: Taking TID   hydrochlorothiazide (HYDRODIURIL) 25 MG tablet Take 25 mg by mouth daily. (Patient not taking: Reported on 12/19/2021)    hydrOXYzine (VISTARIL) 25 MG capsule Take 1 capsule (25 mg total) by mouth 3 (three) times daily as needed for anxiety.    lisinopril (ZESTRIL) 40 MG tablet Take 40 mg by mouth daily. (Patient not taking: Reported on 12/19/2021)    lisinopril-hydrochlorothiazide (ZESTORETIC) 20-12.5 MG tablet Take 1 tablet by mouth daily.    loratadine (CLARITIN) 10 MG tablet Take 10 mg by mouth daily. (Patient not taking: Reported on 12/19/2021)    metFORMIN (GLUCOPHAGE) 1000 MG tablet Take 1,000 mg by mouth 2 (two) times daily with a meal. 01/18/2021: Taking 500mg  BID   oxcarbazepine (TRILEPTAL) 600 MG tablet Take 600 mg by mouth 2 (two) times daily.    traZODone (DESYREL) 50 MG tablet Take 1 tablet (50 mg total) by mouth at bedtime as needed and may repeat dose one time if needed for sleep.    varenicline (CHANTIX PAK) 0.5 MG X 11 & 1 MG X 42 tablet Take 1 mg by mouth taper from 4 doses each day to 1 dose and stop.    No facility-administered encounter medications on file as of 12/20/2021.   Allergies  Allergen Reactions   Prednisone Other (See Comments) and Palpitations   Tamiflu [Oseltamivir] Palpitations    Social History   Tobacco Use   Smoking status: Every Day    Packs/day: 0.50    Types: Cigarettes   Smokeless tobacco: Never  Substance Use Topics   Alcohol use: Not Currently   Functioning Relationships: good support system and strained with spouse or significant others Education: -some Other Pertinent History: None Family History  Problem Relation Age of Onset   Clotting disorder Father    Drug  abuse Maternal Grandfather    Depression Maternal Grandfather    Hypertension Maternal Grandfather    Lung cancer Maternal Grandfather    Diabetes Maternal Grandmother    Hypertension Paternal Grandfather    Heart disease Paternal Grandfather    Heart attack Paternal Grandfather      Review of Systems Constitutional: negative  Objective:  There were no vitals filed for this visit.  Physical Exam:   Mental Status Exam: Appearance:  Well groomed Psychomotor::  Within Normal Limits Attention span and concentration: Normal Behavior: calm, cooperative, and adequate rapport can be established Speech:  normal volume Mood:  depressed Affect:  normal Thought Process:  Coherent Thought Content:  Logical Orientation:  person, place, and time/date Cognition:  grossly intact Insight:  Intact Judgment:  Intact Estimate of Intelligence: Average Fund of knowledge: Aware of current events Memory: Recent and remote intact Abnormal movements: None Gait and station: Normal  Assessment:  Diagnosis: Bipolar disorder, current episode mixed, moderate (HCC) [F31.62] 1. Bipolar disorder, current  episode mixed, moderate (HCC)   2. PTSD (post-traumatic stress disorder)   3. Difficulty coping     Indications for admission: inpatient care required if not in partial hospital program  Plan: patient enrolled in Partial Hospitalization Program, patient's current medications are to be continued, a comprehensive treatment plan will be developed, and side effects of medications have been reviewed with patient  Collaboration of Care: Psychiatrist AEB Johny Blamer with [facility]  Patient/Guardian was advised Release of Information must be obtained prior to any record release in order to collaborate their care with an outside provider. Patient/Guardian was advised if they have not already done so to contact the registration department to sign all necessary forms in order for [place] to  release information regarding their care.   Consent: Patient/Guardian gives verbal consent for treatment and assignment of benefits for services provided during this visit. Patient/Guardian expressed understanding and agreed to proceed.   Treatment options and alternatives reviewed with patient and patient understands the above plan.  Comments: Treatment plan was reviewed and agreed upon by NPT Leianne Callins and patient Ashish Rossetti need for group services.    Maurice Rack, NP

## 2021-12-21 ENCOUNTER — Other Ambulatory Visit (HOSPITAL_COMMUNITY): Payer: 59 | Attending: Psychiatry | Admitting: Licensed Clinical Social Worker

## 2021-12-21 ENCOUNTER — Ambulatory Visit: Payer: 59 | Admitting: Orthopaedic Surgery

## 2021-12-21 ENCOUNTER — Other Ambulatory Visit (HOSPITAL_COMMUNITY): Payer: 59 | Attending: Psychiatry

## 2021-12-21 ENCOUNTER — Encounter (HOSPITAL_COMMUNITY): Payer: Self-pay

## 2021-12-21 DIAGNOSIS — F4310 Post-traumatic stress disorder, unspecified: Secondary | ICD-10-CM | POA: Insufficient documentation

## 2021-12-21 DIAGNOSIS — F332 Major depressive disorder, recurrent severe without psychotic features: Secondary | ICD-10-CM | POA: Insufficient documentation

## 2021-12-21 DIAGNOSIS — F3162 Bipolar disorder, current episode mixed, moderate: Secondary | ICD-10-CM | POA: Diagnosis present

## 2021-12-21 DIAGNOSIS — R4589 Other symptoms and signs involving emotional state: Secondary | ICD-10-CM

## 2021-12-21 NOTE — Progress Notes (Deleted)
Office Visit Note   Patient: Maurice George           Date of Birth: 03/18/1986           MRN: [id number] Visit Date: 12/21/2021              Requested by: Eartha Inch, MD [address] [place],  [place] [address] PCP: Eartha Inch, MD   Assessment & Plan: Visit Diagnoses: No diagnosis found.  Plan: ***  Follow-Up Instructions: No follow-ups on file.   Orders:  No orders of the defined types were placed in this encounter.  No orders of the defined types were placed in this encounter.     Procedures: No procedures performed   Clinical Data: No additional findings.   Subjective: No chief complaint on file.   HPI  Review of Systems  Constitutional: Negative.   All other systems reviewed and are negative.   Objective: Vital Signs: There were no vitals taken for this visit.  Physical Exam Vitals and nursing note reviewed.  Constitutional:      Appearance: He is well-developed.  Pulmonary:     Effort: Pulmonary effort is normal.  Abdominal:     Palpations: Abdomen is soft.  Skin:    General: Skin is warm.  Neurological:     Mental Status: He is alert and oriented to person, place, and time.  Psychiatric:        Behavior: Behavior normal.        Thought Content: Thought content normal.        Judgment: Judgment normal.   Ortho Exam  Specialty Comments:  No specialty comments available.  Imaging: No results found.   PMFS History: Patient Active Problem List   Diagnosis Date Noted  . 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 Ganglion cyst of wrist, left 02/03/2021  . Major depressive disorder, recurrent episode, severe (HCC) 01/17/2021  . MDD (major depressive disorder), recurrent, in full remission (HCC) 08/11/2020  . RLS (restless legs syndrome) 07/30/2020  . Cannabis use disorder, severe, in early remission (HCC) 07/07/2020  . Internal and external bleeding hemorrhoids 04/01/2020  . 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 Rectal bleeding 12/12/2019  . Rectal pain 12/12/2019  . Anal fissure 12/12/2019  . Uncontrolled type 2 diabetes mellitus with hyperglycemia (HCC) 01/01/2019  . Essential hypertension 07/05/2018  . GAD (generalized anxiety disorder) 03/21/2018  . Palpitations 02/27/2017  . Morbid obesity with BMI of 60.0-69.9, adult (HCC) 04/14/2016  . Tobacco use disorder 04/14/2016  . Depression 07/03/2014  . Prediabetes 02/10/2014  . Psychological factors affecting morbid obesity (HCC) 02/04/2014  . Severe cannabis use disorder (HCC) 10/16/2013  . Atrial tachycardia (HCC) 05/29/2013  . Chest pain 05/29/2013  . Morbid obesity (HCC) 05/29/2013  . Hyperlipidemia 05/29/2013  . OSA (obstructive sleep apnea) 05/29/2013   Past Medical History:  Diagnosis Date  . Abnormal liver enzymes   . Anal fissure   . Anxiety   . Asthma   . Backache, unspecified   . Depression   . Diabetes mellitus (HCC)   . H/O hemorrhoids   . History of chicken pox   . Hyperlipidemia   . Hypertension   . Obesity   . Other abnormal glucose   . Sleep apnea     Family History  Problem Relation Age of Onset  . Clotting disorder Father   . Drug abuse Maternal Grandfather   . Depression Maternal Grandfather   .  Hypertension Maternal Grandfather   . Lung cancer Maternal Grandfather   . Diabetes Maternal Grandmother   . Hypertension Paternal Grandfather   . Heart disease Paternal Grandfather   . Heart attack Paternal Grandfather     Past Surgical History:  Procedure Laterality Date  . TONSILLECTOMY  1999   Social History   Occupational History  . Occupation: [employer]  Tobacco Use  . Smoking status: Every Day    Packs/day: 0.50    Types: Cigarettes  . Smokeless tobacco: Never  Vaping Use  . Vaping Use: Never used  Substance and Sexual Activity  . Alcohol use: Not Currently  . Drug use: Yes    Types: Marijuana  . Sexual activity: Yes     Birth control/protection: None

## 2021-12-21 NOTE — Therapy (Signed)
[facility] PARTIAL HOSPITALIZATION PROGRAM [address] [place], [place], [address] Phone: [phone]   Fax:  [phone]  Occupational Therapy Treatment Virtual Visit via Video Note  I connected with Maurice George on 12/21/21 at  8:00 AM EDT by a video enabled telemedicine application and verified that I am speaking with the correct person using two identifiers.  Location: Patient: home Provider: office   I discussed the limitations of evaluation and management by telemedicine and the availability of in person appointments. The patient expressed understanding and agreed to proceed.    The patient was advised to call back or seek an in-person evaluation if the symptoms worsen or if the condition fails to improve as anticipated.  I provided 55 minutes of non-face-to-face time during this encounter.   Patient Details  Name: Maurice George MRN: [id number] Date of Birth: 1986-03-14 Referring Provider (OT): Hillery Jacks   Encounter Date: 12/21/2021   OT End of Session - 12/21/21 1333     Visit Number 3    Number of Visits 20    Date for OT Re-Evaluation 01/14/22    OT Start Time 1200    OT Stop Time 1255    OT Time Calculation (min) 55 min    Activity Tolerance Patient tolerated treatment well    Behavior During Therapy WFL for tasks assessed/performed             Past Medical History:  Diagnosis Date   Abnormal liver enzymes    Anal fissure    Anxiety    Asthma    Backache, unspecified    Depression    Diabetes mellitus (HCC)    H/O hemorrhoids    History of chicken pox    Hyperlipidemia    Hypertension    Obesity    Other abnormal glucose    Sleep apnea     Past Surgical History:  Procedure Laterality Date   TONSILLECTOMY  1999    There were no vitals filed for this visit.   Subjective Assessment - 12/21/21 1333     Currently in Pain? No/denies    Pain Score 0-No pain                 Group Session:  S:  Doing better today.  O: The objective of this presentation is to provide a comprehensive understanding of the concept of "motivation" and its role in human behavior and well-being. The content covers various theories of motivation, including intrinsic and extrinsic motivators, and explores the psychological mechanisms that drive individuals to achieve goals, overcome obstacles, and make decisions. By diving into real-world applications, the presentation aims to offer actionable strategies for enhancing motivation in different life domains, such as work, relationships, and personal growth. Utilizing a multi-disciplinary approach, this presentation integrates insights from psychology, neuroscience, and behavioral economics to present a holistic view of motivation. The objective is not only to educate the audience about the complexities and driving forces behind motivation but also to equip them with practical tools and techniques to improve their own motivation levels. By the end of the presentation, attendees should have a well-rounded understanding of what motivates human actions and how to harness this knowledge for personal and professional betterment.  A: The patient demonstrates a high level of engagement during the session, actively participating in discussions about motivation theories and their applicability to their own life. They show keen interest in learning new strategies to improve their motivation and even offer examples from their own  experiences that align with the theories presented. Their level of self-awareness and willingness to invest in self-improvement suggest that they are well-positioned to benefit from the practical tools and techniques discussed. The patient's ability to articulate their goals and challenges further supports the likelihood of successfully implementing the strategies presented.   P: Continue to attend PHP OT group sessions 5x week for 4 weeks to promote daily  structure, social engagement, and opportunities to develop and utilize adaptive strategies to maximize functional performance in preparation for safe transition and integration back into school, work, and the community. Plan to address topic of Motivation part 2 in next OT group session.                  OT Education - 12/21/21 1333     Education Details Motivation 1/2              OT Short Term Goals - 12/15/21 1638       OT SHORT TERM GOAL #1   Title Pt will actively engage in OT group sessions throughout duration of PHP programming, in order to promote daily structure, social engagement, and opportunities to develop and utilize adaptive strategies to maximize functional performance in preparation for safe transition and integration back into school, work, and the community.    Time 4    Period Weeks    Status New    Target Date 01/14/22      OT SHORT TERM GOAL #2   Title Pt will demonstrate improved ability to communicate feelings/needs/wants, without being angry/irritable/aggressive, as evidenced by, active participation in OT sessions, throughout duration of PHP programming, in order to safely transition back into the community at discharge.    Time 4    Period Weeks    Status New    Target Date 01/14/22      OT SHORT TERM GOAL #3   Title Pt will identify 1-3 stress management strategies he can utilize, in order to safely manage increased psychosocial stressors identified, with min cues, in preparation for safe transition back to the community at discharge.    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, 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   Ganglion cyst of wrist, left 02/03/2021   Major depressive disorder, recurrent episode, severe (HCC) 01/17/2021   MDD (major depressive disorder), recurrent, in full remission (HCC) 08/11/2020   RLS (restless legs syndrome) 07/30/2020   Cannabis use disorder, severe, in early remission (HCC) 07/07/2020   Internal and external bleeding hemorrhoids 04/01/2020   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) 12/24/2019   Rectal bleeding 12/12/2019   Rectal pain 12/12/2019   Anal fissure 12/12/2019   Uncontrolled type 2 diabetes mellitus with hyperglycemia (HCC) 01/01/2019   Essential hypertension 07/05/2018   GAD (generalized anxiety disorder) 03/21/2018   Palpitations 02/27/2017  Morbid obesity with BMI of 60.0-69.9, adult (HCC) 04/14/2016   Tobacco use disorder 04/14/2016   Depression 07/03/2014   Prediabetes 02/10/2014   Psychological factors affecting morbid obesity (HCC) 02/04/2014   Severe cannabis use disorder (HCC) 10/16/2013   Atrial tachycardia (HCC) 05/29/2013   Chest pain 05/29/2013   Morbid obesity (HCC) 05/29/2013   Hyperlipidemia 05/29/2013   OSA (obstructive sleep apnea) 05/29/2013    Ted Mcalpine, OT 12/21/2021, 1:34 PM  Kerrin Champagne, OT   [facility] HOSPITALIZATION PROGRAM [address] [place], [place], [address] Phone: [phone]   Fax:  [phone]  Name: Deionte Spivack MRN: [id number] Date of Birth: 1985-09-01

## 2021-12-21 NOTE — Plan of Care (Signed)
  Problem: Depression CCP Problem  1  Goal: STG: Maurice George" WILL ATTEND AT LEAST 80% OF SCHEDULED PHP SESSIONS Outcome: Not Applicable Goal: STG: Maurice George" WILL ATTEND AT LEAST 80% OF SCHEDULED GROUP PSYCHOTHERAPY SESSIONS Outcome: Not Applicable Goal: STG: Maurice Fallen "Ken" WILL COMPLETE AT LEAST 80% OF ASSIGNED HOMEWORK Outcome: Not Applicable Goal: STG: Reduce overall depression score by a minimum of 25% on the Patient Health Questionnaire (PHQ-9) or the Montgomery-Asberg Depression Rating Scale (MADRS) Outcome: Not Applicable Goal: STG: Maurice George" WILL IDENTIFY AT LEAST 3 COGNITIVE PATTERNS AND BELIEFS THAT SUPPORT DEPRESSION Outcome: Not Applicable  Pt verbally agrees to treatment plan.

## 2021-12-22 ENCOUNTER — Ambulatory Visit (HOSPITAL_COMMUNITY): Payer: 59

## 2021-12-22 ENCOUNTER — Other Ambulatory Visit (HOSPITAL_COMMUNITY): Payer: 59

## 2021-12-23 ENCOUNTER — Other Ambulatory Visit (HOSPITAL_COMMUNITY): Payer: 59

## 2021-12-23 ENCOUNTER — Ambulatory Visit (HOSPITAL_COMMUNITY): Payer: 59

## 2021-12-26 ENCOUNTER — Other Ambulatory Visit (HOSPITAL_COMMUNITY): Payer: 59

## 2021-12-26 ENCOUNTER — Ambulatory Visit (HOSPITAL_COMMUNITY): Payer: 59

## 2021-12-26 ENCOUNTER — Telehealth (HOSPITAL_COMMUNITY): Payer: Self-pay | Admitting: Professional

## 2022-01-04 ENCOUNTER — Encounter (HOSPITAL_COMMUNITY): Payer: Self-pay | Admitting: Professional

## 2022-01-28 NOTE — Psych (Signed)
Virtual Visit via Video Note  I connected with Maurice George on 12/15/21 at  9:00 AM EDT by a video enabled telemedicine application and verified that I am speaking with the correct person using two identifiers.  Location: Patient: patient home Provider: clinical home office   I discussed the limitations of evaluation and management by telemedicine and the availability of in person appointments. The patient expressed understanding and agreed to proceed.  I discussed the assessment and treatment plan with the patient. The patient was provided an opportunity to ask questions and all were answered. The patient agreed with the plan and demonstrated an understanding of the instructions.   The patient was advised to call back or seek an in-person evaluation if the symptoms worsen or if the condition fails to improve as anticipated.  Pt was provided 240 minutes of non-face-to-face time during this encounter.   Lorin Glass, LCSW   [facility] PHP THERAPIST PROGRESS NOTE  Maurice George [id number]  Session Time: 9:00 - 10:00  Participation Level: Active  Behavioral Response: CasualAlertDepressed  Type of Therapy: Group Therapy  Treatment Goals addressed: Coping  Progress Towards Goals: Initial  Interventions: CBT, DBT, Supportive, and Reframing  Summary: Clinician led check-in regarding current stressors and situation, and review of patient completed daily inventory. Clinician utilized active listening and empathetic response and validated patient emotions. Clinician facilitated processing group on pertinent issues.?    Summary:  Maurice George is a 36 y.o. male who presents with depression and mood symptoms.  Patient arrived within time allowed. Patient rates his mood at a 5 on a scale of 1-10 with 10 being best. Pt states he feels "nervous"  Patient able to process. Patient engaged in discussion.          Session Time: 10:00 am - 11:00 am   Participation Level: Active    Behavioral Response: CasualAlertDepressed   Type of Therapy: Group Therapy   Treatment Goals addressed: Coping   Progress Towards Goals: Progressing   Interventions: CBT, DBT, Solution Focused, Strength-based, Supportive, and Reframing   Therapist Response: Cln led discussion on saying "no." Group members shared struggles they have with saying no and how it affects them. Cln utilized boundaries, communication, and self-esteem tenets to inform discussion.   Therapist Response:  Pt engaged in discussion and reports increased understanding of how to say "no."          Session Time: 11:00 -12:00   Participation Level: Active   Behavioral Response: CasualAlertDepressed   Type of Therapy: Group Therapy, Occupational Therapy   Treatment Goals addressed: Coping   Progress Towards Goals: Progressing   Interventions: Supportive, Education   Summary:  Occupational Therapy group led by cln E. Hollan.   Therapist Response: Pt participated       Session Time: 12:00 -1:00   Participation Level: Active   Behavioral Response: CasualAlertDepressed   Type of Therapy: Group therapy   Treatment Goals addressed: Coping   Progress Towards Goals: Progressing   Interventions: CBT; Solution focused; Supportive; Reframing   Summary: 12:00 - 12:50: Cln continued topic of healthy relationships. Cln discussed the way in which self-esteem can affect what we accept in relationships. Cln emphasized focusing on actions rather than words with those we are in relationship with. Group members shared ways in which their self-esteem has affected relationship dynamics.  12:50 -1:00 Clinician led check-out. Clinician assessed for immediate needs, medication compliance and efficacy, and safety concerns.   Therapist Response: 12:00 - 12:50: Pt engaged in discussion and is  able to process and gain insight.  12:50 - 1:00 pm: At check-out, patient reports no immediate concerns. Patient demonstrates  progress as evidenced by participating in first group session. Patient denies SI/HI/self-harm thoughts at the end of group.   Suicidal/Homicidal: Nowithout intent/plan  Plan: Pt will continue in PHP while working to decrease depression and mood symptoms, increase ADLS, and increase ability to manage symptoms in a healthy manner.   Collaboration of Care: Medication Management AEB V Doda  Patient/Guardian was advised Release of Information must be obtained prior to any record release in order to collaborate their care with an outside provider. Patient/Guardian was advised if they have not already done so to contact the registration department to sign all necessary forms in order for [place] to release information regarding their care.   Consent: Patient/Guardian gives verbal consent for treatment and assignment of benefits for services provided during this visit. Patient/Guardian expressed understanding and agreed to proceed.   Diagnosis: Bipolar disorder, current episode mixed, moderate (HCC) [F31.62]    1. Bipolar disorder, current episode mixed, moderate (HCC)   2. GAD (generalized anxiety disorder)   3. PTSD (post-traumatic stress disorder)      Donia Guiles, LCSW

## 2022-01-29 NOTE — Psych (Signed)
Virtual Visit via Video Note  I connected with [name] on 12/19/21 at  9:00 AM EDT by a video enabled telemedicine application and verified that I am speaking with the correct person using two identifiers.  Location: Patient: patient home Provider: clinical home office   I discussed the limitations of evaluation and management by telemedicine and the availability of in person appointments. The patient expressed understanding and agreed to proceed.  I discussed the assessment and treatment plan with the patient. The patient was provided an opportunity to ask questions and all were answered. The patient agreed with the plan and demonstrated an understanding of the instructions.   The patient was advised to call back or seek an in-person evaluation if the symptoms worsen or if the condition fails to improve as anticipated.  Pt was provided 240 minutes of non-face-to-face time during this encounter.   Donia Guiles, LCSW   [facility] PHP THERAPIST PROGRESS NOTE  Howie Rufus [id number]  Session Time: 9:00 - 10:00  Participation Level: Active  Behavioral Response: CasualAlertDepressed  Type of Therapy: Group Therapy  Treatment Goals addressed: Coping  Progress Towards Goals: Initial  Interventions: CBT, DBT, Supportive, and Reframing  Summary: Clinician led check-in regarding current stressors and situation, and review of patient completed daily inventory. Clinician utilized active listening and empathetic response and validated patient emotions. Clinician facilitated processing group on pertinent issues.?    Summary:  Amiel Mccaffrey is a 36 y.o. male who presents with depression and mood symptoms.  Patient arrived within time allowed. Patient rates his mood at a 2 on a scale of 1-10 with 10 being best. Pt states he feels "in pain" due to a hip injury.  Patient able to process. Patient engaged in discussion.          Session Time: 10:00 am - 11:00 am   Participation  Level: Active   Behavioral Response: CasualAlertDepressed   Type of Therapy: Group Therapy   Treatment Goals addressed: Coping   Progress Towards Goals: Progressing   Interventions: CBT, DBT, Solution Focused, Strength-based, Supportive, and Reframing   Therapist Response: Cln led discussion on "spiraling" or when our thoughts compound on one another to descend our feelings into worse and worse situations. Group members discussed the ways in which spiraling is difficult for them and when they are especially vulnerable. Cln offered DBT distraction skills as a way to halt the spiral once you recognize it.    Therapist Response:  Pt engaged in discussion and reports spiraling is a major issue for them. Pt is able to increase awareness of spiraling throughout the discussion.          Session Time: 11:00 -12:00   Participation Level: Active   Behavioral Response: CasualAlertDepressed   Type of Therapy: Group Therapy, Occupational Therapy   Treatment Goals addressed: Coping   Progress Towards Goals: Progressing   Interventions: Supportive, Education   Summary:  Occupational Therapy group led by cln E. Hollan.   Therapist Response: Pt participated       Session Time: 12:00 -1:00   Participation Level: Active   Behavioral Response: CasualAlertDepressed   Type of Therapy: Group therapy   Treatment Goals addressed: Coping   Progress Towards Goals: Progressing   Interventions: CBT; Solution focused; Supportive; Reframing   Summary: 12:00 - 12:50: Cln introduced DBT interpersonal effectiveness skill for self-respect, FAST. Cln led discussion on barriers to utilizing this skill, how it could be helpful, and situations in which they could have utilized it.  12:50 -  1:00 Clinician assessed for immediate needs, medication compliance and efficacy, and safety concerns.   Therapist Response: 12:00 - 12:50: Pt engaged in discussion and reports willingness to utilize FAST.  12:50  - 1:00 pm: At check-out, patient reports no immediate concerns. Patient demonstrates progress as evidenced by engaging in a hobby. Patient denies SI/HI/self-harm thoughts at the end of group.   Suicidal/Homicidal: Nowithout intent/plan  Plan: Pt will continue in PHP while working to decrease depression and mood symptoms, increase ADLS, and increase ability to manage symptoms in a healthy manner.   Collaboration of Care: Medication Management AEB V Doda  Patient/Guardian was advised Release of Information must be obtained prior to any record release in order to collaborate their care with an outside provider. Patient/Guardian was advised if they have not already done so to contact the registration department to sign all necessary forms in order for [place] to release information regarding their care.   Consent: Patient/Guardian gives verbal consent for treatment and assignment of benefits for services provided during this visit. Patient/Guardian expressed understanding and agreed to proceed.   Diagnosis: Severe episode of recurrent major depressive disorder, without psychotic features ([place]) [F33.2]    1. Severe episode of recurrent major depressive disorder, without psychotic features ([place])   2. GAD (generalized anxiety disorder)      Lorin Glass, LCSW

## 2022-01-29 NOTE — Psych (Signed)
Virtual Visit via Video Note  I connected with Maurice George on 12/20/21 at  9:00 AM EDT by a video enabled telemedicine application and verified that I am speaking with the correct person using two identifiers.  Location: Patient: patient home Provider: clinical home office   I discussed the limitations of evaluation and management by telemedicine and the availability of in person appointments. The patient expressed understanding and agreed to proceed.  I discussed the assessment and treatment plan with the patient. The patient was provided an opportunity to ask questions and all were answered. The patient agreed with the plan and demonstrated an understanding of the instructions.   The patient was advised to call back or seek an in-person evaluation if the symptoms worsen or if the condition fails to improve as anticipated.  Pt was provided 120 minutes of non-face-to-face time during this encounter.   Donia Guiles, LCSW   [facility] PHP THERAPIST PROGRESS NOTE  Perle Gibbon [id number]  Session Time: 9:00 - 10:00  Participation Level: Active  Behavioral Response: CasualAlertDepressed  Type of Therapy: Group Therapy  Treatment Goals addressed: Coping  Progress Towards Goals: Initial  Interventions: CBT, DBT, Supportive, and Reframing  Summary: Clinician led check-in regarding current stressors and situation, and review of patient completed daily inventory. Clinician utilized active listening and empathetic response and validated patient emotions. Clinician facilitated processing group on pertinent issues.?    Summary:  Maurice George is a 36 y.o. male who presents with depression and mood symptoms.  Patient arrived within time allowed. Patient rates his mood at a 3 on a scale of 1-10 with 10 being best. Pt states he feels "ok."  Patient able to process. Patient engaged in discussion.          Session Time: 10:00 am - 11:00 am   Participation Level: Active    Behavioral Response: CasualAlertDepressed   Type of Therapy: Group Therapy   Treatment Goals addressed: Coping   Progress Towards Goals: Progressing   Interventions: CBT, DBT, Solution Focused, Strength-based, Supportive, and Reframing   Therapist Response: Cln led discussion on impulsivity. Group discussed struggles with impulsivity. Cln highlighted theme of immediacy. Group built insight around the way immediacy interacts with impulsivity. Cln encouraged pt's to consider mantras and grounding statements to remind themselves there is time to think/feel/act.   Therapist Response: Pt engaged in discussion and is able to make connections and gain insight.     **Pt chose to leave group at 10:30 due to a fit-in medical appt for his hip injury.      Suicidal/Homicidal: Nowithout intent/plan  Plan: Pt will continue in PHP while working to decrease depression and mood symptoms, increase ADLS, and increase ability to manage symptoms in a healthy manner.   Collaboration of Care: Medication Management AEB V Doda  Patient/Guardian was advised Release of Information must be obtained prior to any record release in order to collaborate their care with an outside provider. Patient/Guardian was advised if they have not already done so to contact the registration department to sign all necessary forms in order for [place] to release information regarding their care.   Consent: Patient/Guardian gives verbal consent for treatment and assignment of benefits for services provided during this visit. Patient/Guardian expressed understanding and agreed to proceed.   Diagnosis: Bipolar disorder, current episode mixed, moderate (HCC) [F31.62]    1. Bipolar disorder, current episode mixed, moderate (HCC)   2. PTSD (post-traumatic stress disorder)   3. Difficulty coping      Boneta Lucks  Rylan Bernard, LCSW

## 2022-03-27 NOTE — Psych (Signed)
Virtual Visit via Video Note  I connected with Maurice George on 12/21/21 at  9:00 AM EDT by a video enabled telemedicine application and verified that I am speaking with the correct person using two identifiers.  Location: Patient: patient home Provider: clinical home office   I discussed the limitations of evaluation and management by telemedicine and the availability of in person appointments. The patient expressed understanding and agreed to proceed.  I discussed the assessment and treatment plan with the patient. The patient was provided an opportunity to ask questions and all were answered. The patient agreed with the plan and demonstrated an understanding of the instructions.   The patient was advised to call back or seek an in-person evaluation if the symptoms worsen or if the condition fails to improve as anticipated.  Pt was provided 240 minutes of non-face-to-face time during this encounter.   Donia Guiles, LCSW   [facility] PHP THERAPIST PROGRESS NOTE  Dontrez Pettis [id number]  Session Time: 9:00 - 10:00  Participation Level: Active  Behavioral Response: CasualAlertDepressed  Type of Therapy: Group Therapy  Treatment Goals addressed: Coping  Progress Towards Goals: Initial  Interventions: CBT, DBT, Supportive, and Reframing  Summary: Clinician led check-in regarding current stressors and situation, and review of patient completed daily inventory. Clinician utilized active listening and empathetic response and validated patient emotions. Clinician facilitated processing group on pertinent issues.?    Summary:  Maurice George is a 36 y.o. male who presents with depression and mood symptoms.  Patient arrived within time allowed. Patient rates his mood at a 1 on a scale of 1-10 with 10 being best. Pt states he feels "hopeless." Pt reports continued pain and experiencing passive SI, denying plan or intent. Patient able to process. Patient engaged in discussion.           Session Time: 10:00 am - 11:00 am   Participation Level: Active   Behavioral Response: CasualAlertDepressed   Type of Therapy: Group Therapy   Treatment Goals addressed: Coping   Progress Towards Goals: Progressing   Interventions: CBT, DBT, Solution Focused, Strength-based, Supportive, and Reframing   Therapist Response: Cln led discussion on deep breathing and its therapeutic benefits, using DBT TIPP skills to inform discussion. Group practiced how to breathe from their diaphragms to ensure therapeutic quality and different ways to keep track of regulating breaths.    Therapist Response: Pt engaged in discussion and practice.        Session Time: 11:00 -12:00   Participation Level: Active   Behavioral Response: CasualAlertDepressed   Type of Therapy: Group Therapy, Spiritual Care   Treatment Goals addressed: Coping   Progress Towards Goals: Progressing   Interventions: Supportive, Education   Summary:  Laurell Josephs, Chaplain, led group.   Therapist Response: Pt participated         Session Time: 12:00 -1:00   Participation Level: Active   Behavioral Response: CasualAlertDepressed   Type of Therapy: Group Therapy   Treatment Goals addressed: Coping   Progress Towards Goals: Progressing   Interventions: Supportive, Education   Summary:  Occupational Therapy group led by cln E. Hollan. 12:50 -1:00 Clinician led check-out. Clinician assessed for immediate needs, medication compliance and efficacy, and safety concerns   Therapist Response: 12:00 - 12:50: Pt participated 12:50 - 1:00 pm: At check-out, patient reports no immediate concerns. Patient demonstrates progress as evidenced by engaging in a hobby. Patient denies SI/HI/self-harm thoughts at the end of group.   Suicidal/Homicidal: Nowithout intent/plan  Plan: Pt will continue  in PHP while working to decrease depression and mood symptoms, increase ADLS, and increase ability to manage symptoms in a  healthy manner.   Collaboration of Care: Medication Management AEB V Doda  Patient/Guardian was advised Release of Information must be obtained prior to any record release in order to collaborate their care with an outside provider. Patient/Guardian was advised if they have not already done so to contact the registration department to sign all necessary forms in order for [place] to release information regarding their care.   Consent: Patient/Guardian gives verbal consent for treatment and assignment of benefits for services provided during this visit. Patient/Guardian expressed understanding and agreed to proceed.   Diagnosis: Bipolar disorder, current episode mixed, moderate (HCC) [F31.62]    1. Bipolar disorder, current episode mixed, moderate (HCC)   2. PTSD (post-traumatic stress disorder)      Donia Guiles, LCSW

## 2022-05-24 ENCOUNTER — Emergency Department: Payer: 59

## 2022-05-24 ENCOUNTER — Emergency Department
Admission: EM | Admit: 2022-05-24 | Discharge: 2022-05-24 | Disposition: A | Discharge status: Home / Self Care | Payer: 59 | Attending: Emergency Medicine | Admitting: Emergency Medicine

## 2022-05-24 ENCOUNTER — Other Ambulatory Visit: Payer: Self-pay

## 2022-05-24 DIAGNOSIS — M5417 Radiculopathy, lumbosacral region: Secondary | ICD-10-CM | POA: Insufficient documentation

## 2022-05-24 DIAGNOSIS — J45909 Unspecified asthma, uncomplicated: Secondary | ICD-10-CM | POA: Insufficient documentation

## 2022-05-24 DIAGNOSIS — I10 Essential (primary) hypertension: Secondary | ICD-10-CM | POA: Diagnosis not present

## 2022-05-24 DIAGNOSIS — M25551 Pain in right hip: Secondary | ICD-10-CM | POA: Diagnosis present

## 2022-05-24 MED ORDER — OXYCODONE-ACETAMINOPHEN 5-325 MG PO TABS: 1.0000 | ORAL_TABLET | Freq: Four times a day (QID) | ORAL | 0 refills | Status: AC | PRN

## 2022-05-24 MED ORDER — GABAPENTIN 800 MG PO TABS: 800.0000 mg | ORAL_TABLET | Freq: Three times a day (TID) | ORAL | 0 refills | Status: AC

## 2022-05-24 MED ORDER — GABAPENTIN 400 MG PO CAPS
800.0000 mg | ORAL_CAPSULE | ORAL | Status: AC
Start: 2022-05-24 — End: 2022-05-24
  Administered 2022-05-24: 800 mg via ORAL
  Filled 2022-05-24: qty 2

## 2022-05-24 MED ORDER — OXYCODONE-ACETAMINOPHEN 5-325 MG PO TABS
1.0000 | ORAL_TABLET | Freq: Once | ORAL | Status: AC
  Administered 2022-05-24: 1 via ORAL
  Filled 2022-05-24: qty 1

## 2022-05-24 NOTE — ED Triage Notes (Signed)
Pt here with right side hip and back pain. Pt states he had a hip fx on Apr 26, 2022 and now is having more pain. Pt states he came yesterday and received scans of the wrong hip and was told that he needed to lose weight. Pt states he was unable to walk this morning. Pt denies N/V but is having some diarrhea.

## 2022-05-24 NOTE — ED Provider Notes (Signed)
[facility] Provider Note    Event Date/Time   First MD Initiated Contact with Patient 05/24/22 1002     (approximate)   History   Chief Complaint: Back Pain and Hip Pain   HPI  Maurice George is a 37 y.o. male with a history of sleep apnea, morbid obesity, asthma, hypertension who comes the ED complaining of right posterior hip pain that started about 3 weeks ago after a mechanical fall at home.  He has had some outpatient x-rays which were unremarkable, but still has severe pain with weightbearing.  Denies fever.     Physical Exam   Triage Vital Signs: ED Triage Vitals  Enc Vitals Group     BP 05/24/22 0853 (!) 122/95     Pulse Rate 05/24/22 0850 62     Resp 05/24/22 0850 (!) 22     Temp 05/24/22 0850 98 F (36.7 C)     Temp Source 05/24/22 0850 Oral     SpO2 05/24/22 0850 94 %     Weight 05/24/22 0852 (!) 472 lb 0.1 oz (214.1 kg)     Height 05/24/22 0852 6\' 4"  (1.93 m)     Head Circumference --      Peak Flow --      Pain Score 05/24/22 0852 8     Pain Loc --      Pain Edu? --      Excl. in [place]? --     Most recent vital signs: Vitals:   05/24/22 0850 05/24/22 0853  BP:  (!) 122/95  Pulse: 62   Resp: (!) 22   Temp: 98 F (36.7 C)   SpO2: 94%     General: Awake, no distress.  CV:  Good peripheral perfusion.  Resp:  Normal effort.  Abd:  No distention.  Soft nontender Other:  No midline spinal tenderness.  Pain is reproduced with movement of the right leg passively, but hip range of motion does not cause joint pain.   ED Results / Procedures / Treatments   Labs (all labs ordered are listed, but only abnormal results are displayed) Labs Reviewed - No data to display   EKG    RADIOLOGY CT pelvis interpreted by me, negative for fracture.  Radiology report reviewed noting evidence of L5-S1 nerve root impingement   PROCEDURES:  Procedures   MEDICATIONS ORDERED IN ED: Medications  gabapentin (NEURONTIN) capsule 800 mg  (has no administration in time range)  oxyCODONE-acetaminophen (PERCOCET/ROXICET) 5-325 MG per tablet 1 tablet (1 tablet Oral Given 05/24/22 1045)     IMPRESSION / MDM / ASSESSMENT AND PLAN / ED COURSE  I reviewed the triage vital signs and the nursing notes.                              Differential diagnosis includes, but is not limited to, lumbar compression fracture, pelvis fracture, muscle strain.  Doubt septic arthritis or intra-abdominal pathology  Patient's presentation is most consistent with acute presentation with potential threat to life or bodily function.  Patient presents with ongoing right low back/posterior hip pain.  He reports this has been going on for 2 weeks.  CT is negative for acute findings but does show evidence of lumbosacral radiculopathy.  Discussing this with the patient, he notes that he has previously been treated for the same symptoms with gabapentin which he last filled in November 2023.  Seems to be recurrent chronic problem.  He is working on weight loss.  Counseled him on pursuing physical therapy exercises for his lower back as well.  He has follow-up appointment with his PCP in about 2 weeks.  I will refill his gabapentin.       FINAL CLINICAL IMPRESSION(S) / ED DIAGNOSES   Final diagnoses:  Radicular pain of lumbosacral region     Rx / DC Orders   ED Discharge Orders          Ordered    gabapentin (NEURONTIN) 800 MG tablet  3 times daily        05/24/22 1105    oxyCODONE-acetaminophen (PERCOCET) 5-325 MG tablet  Every 6 hours PRN        05/24/22 1108             Note:  This document was prepared using Dragon voice recognition software and may include unintentional dictation errors.   Carrie Mew, MD 05/24/22 1115

## 2022-05-24 NOTE — ED Triage Notes (Signed)
First Nurse Note;  Pt via EMS from home. Pt c/o R hip pain, states that he had 2 minor falls 2 weeks ago. Pt was seen by orthopedic yesterday and XR was done and negative. States he was told that the pain is from his weight, pt does have a hx of sciatica. Pt is A&Ox4 and NAD. VSS per EMS

## 2022-12-18 ENCOUNTER — Other Ambulatory Visit (HOSPITAL_COMMUNITY): Payer: Self-pay | Admitting: Family

## 2022-12-18 DIAGNOSIS — F3162 Bipolar disorder, current episode mixed, moderate: Secondary | ICD-10-CM

## 2023-11-28 ENCOUNTER — Ambulatory Visit: Admitting: Physician Assistant
